# Patient Record
Sex: Female | Born: 1955 | Race: White | Hispanic: No | Smoking: Never smoker
Health system: Southern US, Community
[De-identification: ages and names within clinical notes are randomized; demographics above are authoritative.]

## PROBLEM LIST (undated history)

## (undated) DIAGNOSIS — K259 Gastric ulcer, unspecified as acute or chronic, without hemorrhage or perforation: Secondary | ICD-10-CM

## (undated) DIAGNOSIS — Z8669 Personal history of other diseases of the nervous system and sense organs: Secondary | ICD-10-CM

## (undated) DIAGNOSIS — G4733 Obstructive sleep apnea (adult) (pediatric): Secondary | ICD-10-CM

## (undated) DIAGNOSIS — G43109 Migraine with aura, not intractable, without status migrainosus: Secondary | ICD-10-CM

## (undated) DIAGNOSIS — F32A Depression, unspecified: Secondary | ICD-10-CM

## (undated) DIAGNOSIS — K219 Gastro-esophageal reflux disease without esophagitis: Secondary | ICD-10-CM

## (undated) DIAGNOSIS — I10 Essential (primary) hypertension: Secondary | ICD-10-CM

## (undated) DIAGNOSIS — R519 Headache, unspecified: Secondary | ICD-10-CM

## (undated) DIAGNOSIS — T7840XA Allergy, unspecified, initial encounter: Secondary | ICD-10-CM

## (undated) DIAGNOSIS — F329 Major depressive disorder, single episode, unspecified: Secondary | ICD-10-CM

## (undated) DIAGNOSIS — N200 Calculus of kidney: Secondary | ICD-10-CM

## (undated) DIAGNOSIS — R439 Unspecified disturbances of smell and taste: Secondary | ICD-10-CM

## (undated) HISTORY — PX: CHOLECYSTECTOMY: SHX55

## (undated) HISTORY — DX: Major depressive disorder, single episode, unspecified: F32.9

## (undated) HISTORY — PX: TENNIS ELBOW RELEASE/NIRSCHEL PROCEDURE: SHX6651

## (undated) HISTORY — PX: KNEE SURGERY: SHX244

## (undated) HISTORY — DX: Unspecified disturbances of smell and taste: R43.9

## (undated) HISTORY — DX: Calculus of kidney: N20.0

## (undated) HISTORY — DX: Allergy, unspecified, initial encounter: T78.40XA

## (undated) HISTORY — DX: Gastro-esophageal reflux disease without esophagitis: K21.9

## (undated) HISTORY — DX: Depression, unspecified: F32.A

## (undated) HISTORY — DX: Gastric ulcer, unspecified as acute or chronic, without hemorrhage or perforation: K25.9

---

## 2002-11-06 HISTORY — PX: CHOLECYSTECTOMY: SHX55

## 2004-11-06 HISTORY — PX: ENDOMETRIAL ABLATION W/ NOVASURE: SUR434

## 2005-02-22 ENCOUNTER — Encounter: Payer: Self-pay | Admitting: General Practice

## 2005-03-06 ENCOUNTER — Encounter: Payer: Self-pay | Admitting: General Practice

## 2005-03-24 ENCOUNTER — Ambulatory Visit: Payer: Self-pay | Admitting: Obstetrics and Gynecology

## 2006-05-08 ENCOUNTER — Ambulatory Visit: Payer: Self-pay | Admitting: Obstetrics and Gynecology

## 2007-02-15 ENCOUNTER — Ambulatory Visit: Payer: Self-pay | Admitting: Unknown Physician Specialty

## 2007-05-13 ENCOUNTER — Ambulatory Visit: Payer: Self-pay | Admitting: Obstetrics and Gynecology

## 2007-09-02 ENCOUNTER — Ambulatory Visit: Payer: Self-pay | Admitting: Physician Assistant

## 2007-12-31 ENCOUNTER — Ambulatory Visit: Payer: Self-pay | Admitting: Unknown Physician Specialty

## 2008-01-14 ENCOUNTER — Ambulatory Visit: Payer: Self-pay | Admitting: Unknown Physician Specialty

## 2008-05-13 ENCOUNTER — Ambulatory Visit: Payer: Self-pay | Admitting: Obstetrics and Gynecology

## 2009-03-11 ENCOUNTER — Ambulatory Visit: Payer: Self-pay | Admitting: Physician Assistant

## 2009-03-18 ENCOUNTER — Ambulatory Visit: Payer: Self-pay | Admitting: Internal Medicine

## 2009-05-17 ENCOUNTER — Ambulatory Visit: Payer: Self-pay | Admitting: Obstetrics and Gynecology

## 2010-01-17 ENCOUNTER — Ambulatory Visit: Payer: Self-pay | Admitting: Internal Medicine

## 2010-05-18 ENCOUNTER — Ambulatory Visit: Payer: Self-pay | Admitting: Obstetrics and Gynecology

## 2011-06-14 ENCOUNTER — Ambulatory Visit: Payer: Self-pay | Admitting: Obstetrics and Gynecology

## 2012-06-17 ENCOUNTER — Ambulatory Visit: Payer: Self-pay | Admitting: Obstetrics and Gynecology

## 2012-11-06 HISTORY — PX: OTHER SURGICAL HISTORY: SHX169

## 2013-06-16 ENCOUNTER — Ambulatory Visit: Payer: Self-pay | Admitting: Unknown Physician Specialty

## 2013-06-18 ENCOUNTER — Ambulatory Visit: Payer: Self-pay | Admitting: Obstetrics and Gynecology

## 2013-06-20 ENCOUNTER — Ambulatory Visit: Payer: Self-pay | Admitting: Obstetrics and Gynecology

## 2014-01-15 DIAGNOSIS — E559 Vitamin D deficiency, unspecified: Secondary | ICD-10-CM | POA: Insufficient documentation

## 2014-01-15 DIAGNOSIS — E039 Hypothyroidism, unspecified: Secondary | ICD-10-CM | POA: Insufficient documentation

## 2014-01-15 DIAGNOSIS — N83209 Unspecified ovarian cyst, unspecified side: Secondary | ICD-10-CM | POA: Insufficient documentation

## 2014-01-15 DIAGNOSIS — K219 Gastro-esophageal reflux disease without esophagitis: Secondary | ICD-10-CM | POA: Insufficient documentation

## 2014-03-05 ENCOUNTER — Ambulatory Visit: Payer: Self-pay | Admitting: Unknown Physician Specialty

## 2014-06-26 ENCOUNTER — Ambulatory Visit: Payer: Self-pay | Admitting: Obstetrics and Gynecology

## 2014-12-29 DIAGNOSIS — G44219 Episodic tension-type headache, not intractable: Secondary | ICD-10-CM | POA: Insufficient documentation

## 2015-03-16 DIAGNOSIS — R739 Hyperglycemia, unspecified: Secondary | ICD-10-CM | POA: Insufficient documentation

## 2015-04-15 DIAGNOSIS — M778 Other enthesopathies, not elsewhere classified: Secondary | ICD-10-CM | POA: Insufficient documentation

## 2015-06-17 ENCOUNTER — Other Ambulatory Visit: Payer: Self-pay | Admitting: Obstetrics and Gynecology

## 2015-06-17 DIAGNOSIS — Z1231 Encounter for screening mammogram for malignant neoplasm of breast: Secondary | ICD-10-CM

## 2015-06-28 ENCOUNTER — Ambulatory Visit
Admission: RE | Admit: 2015-06-28 | Discharge: 2015-06-28 | Disposition: A | Discharge status: Home / Self Care | Payer: BLUE CROSS/BLUE SHIELD | Source: Ambulatory Visit | Attending: Obstetrics and Gynecology | Admitting: Obstetrics and Gynecology

## 2015-06-28 DIAGNOSIS — Z1231 Encounter for screening mammogram for malignant neoplasm of breast: Secondary | ICD-10-CM | POA: Insufficient documentation

## 2015-12-24 ENCOUNTER — Emergency Department: Payer: BLUE CROSS/BLUE SHIELD

## 2015-12-24 ENCOUNTER — Encounter: Payer: Self-pay | Admitting: Emergency Medicine

## 2015-12-24 ENCOUNTER — Emergency Department
Admission: EM | Admit: 2015-12-24 | Discharge: 2015-12-24 | Disposition: A | Discharge status: Home / Self Care | Payer: BLUE CROSS/BLUE SHIELD | Attending: Emergency Medicine | Admitting: Emergency Medicine

## 2015-12-24 DIAGNOSIS — R0602 Shortness of breath: Secondary | ICD-10-CM | POA: Diagnosis not present

## 2015-12-24 DIAGNOSIS — R079 Chest pain, unspecified: Secondary | ICD-10-CM | POA: Diagnosis present

## 2015-12-24 DIAGNOSIS — R0789 Other chest pain: Secondary | ICD-10-CM | POA: Insufficient documentation

## 2015-12-24 DIAGNOSIS — Z79899 Other long term (current) drug therapy: Secondary | ICD-10-CM | POA: Insufficient documentation

## 2015-12-24 DIAGNOSIS — I10 Essential (primary) hypertension: Secondary | ICD-10-CM | POA: Diagnosis not present

## 2015-12-24 DIAGNOSIS — J3489 Other specified disorders of nose and nasal sinuses: Secondary | ICD-10-CM | POA: Insufficient documentation

## 2015-12-24 HISTORY — DX: Essential (primary) hypertension: I10

## 2015-12-24 LAB — BASIC METABOLIC PANEL
ANION GAP: 6 (ref 5–15)
BUN: 17 mg/dL (ref 6–20)
CHLORIDE: 106 mmol/L (ref 101–111)
CO2: 27 mmol/L (ref 22–32)
Calcium: 9 mg/dL (ref 8.9–10.3)
Creatinine, Ser: 0.93 mg/dL (ref 0.44–1.00)
GFR calc Af Amer: >60 mL/min (ref >60)
GLUCOSE: 106 mg/dL — AB (ref 65–99)
POTASSIUM: 3.9 mmol/L (ref 3.5–5.1)
Sodium: 139 mmol/L (ref 135–145)

## 2015-12-24 LAB — CBC
HEMATOCRIT: 39.4 % (ref 35.0–47.0)
HEMOGLOBIN: 13.1 g/dL (ref 12.0–16.0)
MCH: 27.7 pg (ref 26.0–34.0)
MCHC: 33.3 g/dL (ref 32.0–36.0)
MCV: 83.3 fL (ref 80.0–100.0)
Platelets: 256 10*3/uL (ref 150–440)
RBC: 4.72 MIL/uL (ref 3.80–5.20)
RDW: 14.1 % (ref 11.5–14.5)
WBC: 8.4 10*3/uL (ref 3.6–11.0)

## 2015-12-24 LAB — TROPONIN I

## 2015-12-24 MED ORDER — NAPROXEN 500 MG PO TABS: 500.0000 mg | ORAL_TABLET | Freq: Two times a day (BID) | ORAL | Status: DC

## 2015-12-24 MED ORDER — IPRATROPIUM-ALBUTEROL 0.5-2.5 (3) MG/3ML IN SOLN
3.0000 mL | Freq: Once | RESPIRATORY_TRACT | Status: AC
  Administered 2015-12-24: 3 mL via RESPIRATORY_TRACT
  Filled 2015-12-24: qty 3

## 2015-12-24 NOTE — ED Provider Notes (Signed)
[facility] Emergency Department Provider Note  ____________________________________________  Time seen: 3:20 PM  I have reviewed the triage vital signs and the nursing notes.   HISTORY  Chief Complaint Chest Pain    HPI Phyllis Wright is a 60 y.o. female who complains of gradual onset left-sided chest pain that feels like heaviness and tightness that started yesterday about 7 PM. She states that she was at rest at the time and it is not worse with exertion. She also feels somewhat short of breath. But no vomiting or diaphoresis. Nonradiating. It is not pleuritic or exertional. No recent travel trauma hospitalization surgery, no prior DVT or PE. Does not take any exogenous hormones. Nonsmoker.     Past Medical History  Diagnosis Date  . Hypertension      There are no active problems to display for this patient.    Past Surgical History  Procedure Laterality Date  . Cholecystectomy       Current Outpatient Rx  Name  Route  Sig  Dispense  Refill  . ALPRAZolam (XANAX) 0.5 MG tablet   Oral   Take 0.5 mg by mouth 2 (two) times daily as needed for anxiety.         . butalbital-acetaminophen-caffeine (FIORICET WITH CODEINE) 50-325-40-30 MG capsule   Oral   Take 1-2 capsules by mouth 2 (two) times daily as needed for headache.         . DULoxetine (CYMBALTA) 60 MG capsule   Oral   Take 60 mg by mouth daily.         Marland Kitchen lisinopril (PRINIVIL,ZESTRIL) 10 MG tablet   Oral   Take 10 mg by mouth daily.         Marland Kitchen omeprazole (PRILOSEC) 20 MG capsule   Oral   Take 20 mg by mouth 2 (two) times daily.         . naproxen (NAPROSYN) 500 MG tablet   Oral   Take 1 tablet (500 mg total) by mouth 2 (two) times daily with a meal.   20 tablet   0      Allergies Ceftin   Family History  Problem Relation Age of Onset  . Breast cancer Mother 73    Social History Social History  Substance Use Topics  . Smoking status: Never Smoker   .  Smokeless tobacco: None  . Alcohol Use: No    Review of Systems  Constitutional:   No fever or chills. No weight changes Eyes:   No blurry vision or double vision.  ENT:   No sore throat.  Cardiovascular:   Positive as above chest pain. Respiratory:   Positive shortness of breath, no cough. Gastrointestinal:   Negative for abdominal pain, vomiting and diarrhea.  No BRBPR or melena. Genitourinary:   Negative for dysuria or difficulty urinating. Musculoskeletal:   Negative for back pain. No joint swelling or pain. Skin:   Negative for rash. Neurological:   Negative for headaches, focal weakness or numbness. Psychiatric:  No anxiety or depression.   Endocrine:  No changes in energy or sleep difficulty.  10-point ROS otherwise negative.  ____________________________________________   PHYSICAL EXAM:  VITAL SIGNS: ED Triage Vitals  Enc Vitals Group     BP 12/24/15 1506 121/52 mmHg     Pulse Rate 12/24/15 1506 83     Resp 12/24/15 1506 20     Temp 12/24/15 1506 97.7 F (36.5 C)     Temp Source 12/24/15 1506 Oral  SpO2 12/24/15 1506 95 %     Weight 12/24/15 1506 145 lb (65.772 kg)     Height 12/24/15 1506 5\' 3"  (1.6 m)     Head Cir --      Peak Flow --      Pain Score 12/24/15 1505 4     Pain Loc --      Pain Edu? --      Excl. in [place]? --     Vital signs reviewed, nursing assessments reviewed.   Constitutional:   Alert and oriented. Well appearing and in no distress. Eyes:   No scleral icterus. No conjunctival pallor. PERRL. EOMI ENT   Head:   Normocephalic and atraumatic.   Nose:   Positive rhinorrhea. No septal hematoma   Mouth/Throat:   MMM, no pharyngeal erythema. No peritonsillar mass.    Neck:   No stridor. No SubQ emphysema. No meningismus. Hematological/Lymphatic/Immunilogical:   No cervical lymphadenopathy. Cardiovascular:   RRR. Symmetric bilateral radial and DP pulses.  No murmurs.  Respiratory:   Normal respiratory effort without tachypnea  nor retractions. Breath sounds are clear and equal bilaterally. No wheezes/rales/rhonchi. No inducible wheezing or coughing with forceful expiration Gastrointestinal:   Soft and nontender. Non distended. There is no CVA tenderness.  No rebound, rigidity, or guarding. Genitourinary:   deferred Musculoskeletal:   Nontender with normal range of motion in all extremities. No joint effusions.  No lower extremity tenderness.  No edema. Left upper chest wall is tender over the second and third intercostal spaces just lateral to the sternal border, which reproduces her pain. Neurologic:   Normal speech and language.  CN 2-10 normal. Motor grossly intact. No gross focal neurologic deficits are appreciated.  Skin:    Skin is warm, dry and intact. No rash noted.  No petechiae, purpura, or bullae. Psychiatric:   Mood and affect are normal. No SI or hallucinations ____________________________________________    LABS (pertinent positives/negatives) (all labs ordered are listed, but only abnormal results are displayed) Labs Reviewed  BASIC METABOLIC PANEL - Abnormal; Notable for the following:    Glucose, Bld 106 (*)    All other components within normal limits  TROPONIN I  CBC   ____________________________________________   EKG  Interpreted by me Normal sinus rhythm rate of 77, normal axis intervals QRS and ST segments and T waves  ____________________________________________    RADIOLOGY  cxr = nad  ____________________________________________   PROCEDURES   ____________________________________________   INITIAL IMPRESSION / ASSESSMENT AND PLAN / ED COURSE  Pertinent labs & imaging results that were available during my care of the patient were reviewed by me and considered in my medical decision making (see chart for details).  Patient presents with atypical chest pain.  Considering the patient's symptoms, medical history, and physical examination today, I have low suspicion  for ACS, PE, TAD, pneumothorax, carditis, mediastinitis, pneumonia, CHF, or sepsis. With the pain being reproducible chest wall palpation this appears to be an intercostal strain versus costochondritis. There is some evidence that she probably has a URI or viral illness as well. We'll have her take NSAIDs and follow up with primary care. We'll get a breathing treatment here in the ER in case that helps although she has no wheezing or other pulmonary findings.       ____________________________________________   FINAL CLINICAL IMPRESSION(S) / ED DIAGNOSES  Final diagnoses:  Chest wall pain      Carrie Mew, MD 12/24/15 [phone]

## 2015-12-24 NOTE — ED Notes (Signed)
States she developed some chest   Heaviness in chest last pm  Feels like something is sitting on chest and she can't get a breath

## 2015-12-24 NOTE — ED Notes (Signed)
Patient transported to X-ray at this time 

## 2015-12-24 NOTE — Discharge Instructions (Signed)

## 2016-06-27 ENCOUNTER — Other Ambulatory Visit: Payer: Self-pay | Admitting: Obstetrics and Gynecology

## 2016-06-27 DIAGNOSIS — Z1231 Encounter for screening mammogram for malignant neoplasm of breast: Secondary | ICD-10-CM

## 2016-07-05 ENCOUNTER — Ambulatory Visit: Payer: BLUE CROSS/BLUE SHIELD

## 2016-07-21 ENCOUNTER — Ambulatory Visit
Admission: RE | Admit: 2016-07-21 | Discharge: 2016-07-21 | Disposition: A | Discharge status: Home / Self Care | Payer: BLUE CROSS/BLUE SHIELD | Source: Ambulatory Visit | Attending: Obstetrics and Gynecology | Admitting: Obstetrics and Gynecology

## 2016-07-21 DIAGNOSIS — Z1231 Encounter for screening mammogram for malignant neoplasm of breast: Secondary | ICD-10-CM | POA: Insufficient documentation

## 2016-08-01 ENCOUNTER — Encounter: Payer: Self-pay | Admitting: *Deleted

## 2016-08-15 ENCOUNTER — Encounter: Payer: Self-pay | Admitting: General Surgery

## 2016-08-15 ENCOUNTER — Ambulatory Visit (INDEPENDENT_AMBULATORY_CARE_PROVIDER_SITE_OTHER): Payer: BLUE CROSS/BLUE SHIELD | Admitting: General Surgery

## 2016-08-15 VITALS — BP 118/66 | HR 74 | Resp 12 | Ht 63.0 in | Wt 146.0 lb

## 2016-08-15 DIAGNOSIS — D1720 Benign lipomatous neoplasm of skin and subcutaneous tissue of unspecified limb: Secondary | ICD-10-CM | POA: Diagnosis not present

## 2016-08-15 DIAGNOSIS — D171 Benign lipomatous neoplasm of skin and subcutaneous tissue of trunk: Secondary | ICD-10-CM | POA: Diagnosis not present

## 2016-08-15 NOTE — Patient Instructions (Addendum)
Follow up as needed

## 2016-08-15 NOTE — Progress Notes (Signed)
Patient ID: Phyllis Wright, female   DOB: November 05, 1956, 60 y.o.   MRN: [id number]  Chief Complaint  Patient presents with  . Lipoma    HPI Phyllis Wright is a 60 y.o. female here today for a evaluation of a lipoma on lower back. Patient states she noticed this area about five years ago. No change in size, some pain with activity. Patient states she has lost about 20 pounds over the last two years.  HPI  Past Medical History:  Diagnosis Date  . Allergy   . GERD (gastroesophageal reflux disease)   . Hypertension   . Smell or taste problem     Past Surgical History:  Procedure Laterality Date  . CHOLECYSTECTOMY    . colonoscopy  2014  . KNEE SURGERY Right   . TENNIS ELBOW RELEASE/NIRSCHEL PROCEDURE      Family History  Problem Relation Age of Onset  . Breast cancer Mother 24  . Prostate cancer Father   . Ovarian cancer Sister     Social History Social History  Substance Use Topics  . Smoking status: Never Smoker  . Smokeless tobacco: Never Used  . Alcohol use No    Allergies  Allergen Reactions  . Ceftin [Cefuroxime Axetil] Itching and Rash    Current Outpatient Prescriptions  Medication Sig Dispense Refill  . ALPRAZolam (XANAX) 0.5 MG tablet Take 0.5 mg by mouth 2 (two) times daily as needed for anxiety.    Marland Kitchen b complex vitamins capsule Take 1 capsule by mouth daily.    . butalbital-acetaminophen-caffeine (FIORICET WITH CODEINE) 50-325-40-30 MG capsule Take 1-2 capsules by mouth 2 (two) times daily as needed for headache.    . calcium carbonate (OSCAL) 1500 (600 Ca) MG TABS tablet Take 600 mg of elemental calcium by mouth daily.    . cholecalciferol (VITAMIN D) 1000 units tablet Take 1,000 Units by mouth daily.    . DULoxetine (CYMBALTA) 60 MG capsule Take 60 mg by mouth daily.    Marland Kitchen lisinopril (PRINIVIL,ZESTRIL) 10 MG tablet Take 10 mg by mouth at bedtime.     . Melatonin 10 MG TABS Take 10 mg by mouth at bedtime.    Marland Kitchen omeprazole (PRILOSEC) 20 MG capsule Take 20 mg  by mouth 2 (two) times daily.     No current facility-administered medications for this visit.     Review of Systems Review of Systems  Constitutional: Negative.   Respiratory: Negative.   Cardiovascular: Negative.     Blood pressure 118/66, pulse 74, resp. rate 12, height 5\' 3"  (1.6 m), weight 146 lb (66.2 kg).  Physical Exam Physical Exam  Constitutional: She is oriented to person, place, and time. She appears well-developed and well-nourished.  Eyes: Conjunctivae are normal. No scleral icterus.  Neck: Neck supple.  Cardiovascular: Normal rate, regular rhythm and normal heart sounds.   Pulmonary/Chest: Effort normal and breath sounds normal.  Musculoskeletal:       Back:  Lymphadenopathy:    She has no cervical adenopathy.  Neurological: She is alert and oriented to person, place, and time.  Skin: Skin is warm and dry.    Data Reviewed Dermatology notes of 07/27/2016  Assessment    Symptomatic lipoma involving the left back.    Plan    Excision options were reviewed. The patient was amenable to proceed. The skin was cleansed with ChloraPrep and 20 mL of 0.5% Xylocaine with 0.25% Marcaine with 1 200,000 epinephrine was utilized well tolerated. ChloraPrep was again applied to the skin.  A transverse incision was made over the lesion. This was a superficially located multilobulated lipoma above the level of the deep fascia. No bleeding was noted. The wound was closed with a running 3-0 Vicryl deep dermal suture. Benzoin and Steri-Strips applied followed by Telfa and Tegaderm dressing.  Ice pack applied. Postoperative wound care reviewed. Tegaderm to be removed in 3 days. She may return in one week for nursing assessment if desired.    This information has been scribed by Gaspar Cola CMA.   Robert Bellow 08/15/2016, 5:02 PM

## 2016-08-22 ENCOUNTER — Telehealth: Payer: Self-pay | Admitting: *Deleted

## 2016-08-22 NOTE — Telephone Encounter (Signed)
Patient is aware of pathology results, she verbally understood and is pleased.

## 2016-12-27 ENCOUNTER — Ambulatory Visit (INDEPENDENT_AMBULATORY_CARE_PROVIDER_SITE_OTHER): Payer: BLUE CROSS/BLUE SHIELD | Admitting: Licensed Clinical Social Worker

## 2016-12-27 ENCOUNTER — Encounter: Payer: Self-pay | Admitting: Licensed Clinical Social Worker

## 2016-12-27 DIAGNOSIS — F411 Generalized anxiety disorder: Secondary | ICD-10-CM

## 2016-12-27 DIAGNOSIS — F332 Major depressive disorder, recurrent severe without psychotic features: Secondary | ICD-10-CM

## 2016-12-27 NOTE — Progress Notes (Signed)
Comprehensive Clinical Assessment (CCA) Note  12/27/2016 Phyllis Wright [id number]  Visit Diagnosis:      ICD-9-CM ICD-10-CM   1. Severe episode of recurrent major depressive disorder, without psychotic features ([place]) 296.33 F33.2   2. Generalized anxiety disorder 300.02 F41.1       CCA Part One  Part One has been completed on paper by the patient.  (See scanned document in Chart Review)  CCA Part Two A  Intake/Chief Complaint:  CCA Intake With Chief Complaint CCA Part Two Date: 12/27/16 CCA Part Two Time: 1402 Chief Complaint/Presenting Problem: She is under a lot of stress. 4 years ago lost taste and smell. 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 Parents 16 & 55 this year. Mother has dementia-alzheimer's. Father slipping also. Has hearing aids that he will not wear. Lots of yelling in house. Sister moved to [place] 30 years ago and diagnosed with a rare cancer. She does not have a very good prognosis. One of her daughters is pregnant. They are keeping it from grandparents. grandparents want to help but are more work than helpful. Sister was suicidal last month. Husband 76 this year. recovering alcoholic 20 + years quadruple bypass in 95. Probably having bilateral renal artery by pass in spring. stress with stepchildren Patients Currently Reported Symptoms/Problems: stress, anxiety, depression Collateral Involvement: supports-husband,  Individual's Strengths: "I have not strangled any of them yet" "I am still here" Individual's Preferences: "I should be happier"  Individual's Abilities: computers Type of Services Patient Feels Are Needed: medication management, therapist Initial Clinical Notes/Concerns: no past psychiatric history, need to get eyelid surgery, things in limbo until surgery, probably not have to go back to work and that should be good but still in limbo  Mental Health Symptoms Depression:  Depression: Tearfulness, Change in  energy/activity, Fatigue, Difficulty Concentrating, Increase/decrease in appetite, Irritability, Sleep (too much or little), Worthlessness (denies SI, denies past SA or SIB, rates depression as a 5 out of 10)  Mania:  Mania: N/A  Anxiety:   Anxiety: Difficulty concentrating, Fatigue, Irritability, Restlessness, Sleep, Tension, Worrying (denies panic)  Psychosis:  Psychosis: N/A  Trauma:  Trauma: N/A  Obsessions:  Obsessions: N/A  Compulsions:  Compulsions: N/A  Inattention:  Inattention: N/A  Hyperactivity/Impulsivity:  Hyperactivity/Impulsivity: N/A  Oppositional/Defiant Behaviors:  Oppositional/Defiant Behaviors: N/A  Borderline Personality:  Emotional Irregularity: N/A  Other Mood/Personality Symptoms:      Mental Status Exam Appearance and self-care  Stature:  Stature: Small  Weight:  Weight: Average weight  Clothing:  Clothing: Casual  Grooming:  Grooming: Normal  Cosmetic use:  Cosmetic Use: None  Posture/gait:  Posture/Gait: Normal  Motor activity:  Motor Activity: Not Remarkable  Sensorium  Attention:  Attention: Normal  Concentration:  Concentration: Normal  Orientation:  Orientation: X5  Recall/memory:  Recall/Memory: Normal  Affect and Mood  Affect:  Affect: Depressed  Mood:  Mood: Anxious, Depressed  Relating  Eye contact:  Eye Contact: Normal  Facial expression:  Facial Expression: Depressed, Sad  Attitude toward examiner:  Attitude Toward Examiner: Cooperative  Thought and Language  Speech flow: Speech Flow: Normal  Thought content:  Thought Content: Appropriate to mood and circumstances  Preoccupation:     Hallucinations:     Organization:     Transport planner of Knowledge:  Fund of Knowledge: Average  Intelligence:  Intelligence: Average  Abstraction:  Abstraction: Normal  Judgement:  Judgement: Fair  Art therapist:  Pension scheme manager  Insight:  Insight: Fair  Decision Making:  Decision Making: Normal  Social Functioning  Social  Maturity:  Social Maturity: Responsible  Social Judgement:  Social Judgement: Normal  Stress  Stressors:  Stressors: Transitions (family stressors-parents, sister, stepchildren)  Coping Ability:  Coping Ability: English as a second language teacher Deficits:     Supports:      Family and Psychosocial History: Family history Marital status: Married Number of Years Married: 30 What types of issues is patient dealing with in the relationship?: worried about husband's medical issues Are you sexually active?: Yes What is your sexual orientation?: heterosexual Has your sexual activity been affected by drugs, alcohol, medication, or emotional stress?: stress Does patient have children?: Yes How many children?: 2 How is patient's relationship with their children?: two stepkids-stepson is lazy, take too much of her money, stepdaughter is alcoholic and won't admit it, worries about grandchildren, he is 51 and she will be 68  Childhood History:  Childhood History By whom was/is the patient raised?: Both parents Additional childhood history information: good Description of patient's relationship with caregiver when they were a child: mom, dad Patient's description of current relationship with people who raised him/her: stressful How were you disciplined when you got in trouble as a child/adolescent?: didn't have to be very much Does patient have siblings?: Yes Number of Siblings: 1 Description of patient's current relationship with siblings: good -younger sister Did patient suffer any verbal/emotional/physical/sexual abuse as a child?: No Did patient suffer from severe childhood neglect?: No Has patient ever been sexually abused/assaulted/raped as an adolescent or adult?: No Was the patient ever a victim of a crime or a disaster?: No Witnessed domestic violence?: No Has patient been effected by domestic violence as an adult?: No  CCA Part Two B  Employment/Work Situation: Employment / Work  Copywriter, advertising Employment situation: Unemployed (lost job and got 80 months severance, Chief of Staff worked at [employer]) What is the longest time patient has a held a job?: 27 Where was the patient employed at that time?: [employer]-started with paying insurance claims, benefits, running the payroll-HR Has patient ever been in the [employer]?: No Has patient ever served in combat?: No Did You Receive Any Psychiatric Treatment/Services While in Passenger transport manager?: No Are There Guns or Other Weapons in [place]?: Yes Types of Guns/Weapons: [place], [place], a couple of shotguns Are These Psychologist, educational?: Yes  Education: Education School Currently Attending: no Last Grade Completed: 13 Name of High School: [employer] Did You Graduate From [employer]?: Yes Did Physicist, medical?: Yes What Was Your Major?: planning on some type of medical technology Did You Have An Individualized Education Program (IIEP): No Did You Have Any Difficulty At School?: No  Religion: Religion/Spirituality Are You A Religious Person?: No  Leisure/Recreation: Leisure / Recreation Leisure and Hobbies: reading, Haematologist, travel  Exercise/Diet: Exercise/Diet Do You Exercise?: No Have You Gained or Lost A Significant Amount of Weight in the Past Six Months?: No Do You Follow a Special Diet?: No Do You Have Any Trouble Sleeping?: Yes Explanation of Sleeping Difficulties: can't quit thinking sometimes so trouble sleeping  CCA Part Two C  Alcohol/Drug Use: Alcohol / Drug Use History of alcohol / drug use?: No history of alcohol / drug abuse                      CCA Part Three  ASAM's:  Six Dimensions of Multidimensional Assessment  Dimension 1:  Acute Intoxication and/or Withdrawal Potential:  Dimension 2:  Biomedical Conditions and Complications:     Dimension 3:  Emotional, Behavioral, or Cognitive Conditions and Complications:     Dimension 4:  Readiness to Change:     Dimension 5:   Relapse, Continued use, or Continued Problem Potential:     Dimension 6:  Recovery/Living Environment:      Substance use Disorder (SUD)    Social Function:  Social Functioning Social Maturity: Responsible Social Judgement: Normal  Stress:  Stress Stressors: Transitions (family stressors-parents, sister, stepchildren) Coping Ability: Overwhelmed Patient Takes Medications The Way The Doctor Instructed?: Yes Priority Risk: Low Acuity  Risk Assessment- Self-Harm Potential: Risk Assessment For Self-Harm Potential Thoughts of Self-Harm: No current thoughts Method: No plan Availability of Means: No access/NA Additional Information for Self-Harm Potential: Family History of Suicide Additional Comments for Self-Harm Potential: father at 20 said he was going to shoot himself and he went into the hospital for a couple of days, he was cheating on mom  Risk Assessment -Dangerous to Others Potential: Risk Assessment For Dangerous to Others Potential Method: No Plan Availability of Means: No access or NA Intent: Vague intent or NA Notification Required: No need or identified person  DSM5 Diagnoses: Patient Active Problem List   Diagnosis Date Noted  . Lipoma of lower back 08/15/2016    Patient Centered Plan: Patient is on the following Treatment Plan(s):  Anxiety and Depression, stress management, and plan will be formulated at next treatment session  Recommendations for Services/Supports/Treatments: Recommendations for Services/Supports/Treatments Recommendations For Services/Supports/Treatments: Individual Therapy, Medication Management  Treatment Plan Summary: Patient is a 60 year old married female who is self-referred and presents with depression and anxiety. She shared that she has lots of stressors currently. She has aging parents putting her mom who has Alzheimer's and their care is becoming more difficult. Her sister has a rare cancer, she lost her job of 27 years a year ago  and frustrated with company who hurt the person she trained, and relates stress with stepchildren. She denies HI, SI or past SA, SIB or substance abuse. This is her first psychiatric experience. She is recommended for individual therapy to help with coping strategies for stressors, emotional regulation strategies, supportive interventions as well as referral to psychiatrist    Referrals to Alternative Service(s): Referred to Alternative Service(s):   Place:   Date:   Time:    Referred to Alternative Service(s):   Place:   Date:   Time:    Referred to Alternative Service(s):   Place:   Date:   Time:    Referred to Alternative Service(s):   Place:   Date:   Time:     Ruger Saxer A

## 2017-01-11 ENCOUNTER — Ambulatory Visit (INDEPENDENT_AMBULATORY_CARE_PROVIDER_SITE_OTHER): Payer: BLUE CROSS/BLUE SHIELD | Admitting: Licensed Clinical Social Worker

## 2017-01-11 DIAGNOSIS — F332 Major depressive disorder, recurrent severe without psychotic features: Secondary | ICD-10-CM

## 2017-01-11 DIAGNOSIS — F411 Generalized anxiety disorder: Secondary | ICD-10-CM

## 2017-01-11 NOTE — Progress Notes (Signed)
   THERAPIST PROGRESS NOTE  Session Time: 8:05 AM to 9 PM  Participation Level: Active  Behavioral Response: CasualAlertDysphoric  Type of Therapy: Individual Therapy  Treatment Goals addressed:  decrease in depression, anxiety  Interventions: Solution Focused, Strength-based, Supportive and Other: Stress management  Summary: Phyllis Wright is a 61 y.o. female who presents with discussing stressors. The stress of the unknown that includes whether she will be retiring in the near future or not. Describes depressive symptoms of feeling stuck, not doing things and can't get started. She has migraines started Zonegran but keeping her awake. She described the stress of managing the care of her parents who live nearby. She describes they are unaware of her own limitations and need close monitoring. She has  no outlet to get away from it all. She has time for heself but waiting for what is going to happen next. She completed treatment plan and wants to work on strategies to help decrease depression, anxiety, manage stress and one of the indications of her progress will be not stepping out is her husband is much. Discussed medications and patient feels it will be helpful in relieving symptoms so she is set up an appointment with the psychiatrist. She reviewed session and session helped her come to awareness that she has to take over more of her parents care, should she is going to get them angry but she is going to take control so she does not have to worry about them.  Suicidal/Homicidal: No  Therapist Response: Formulated a treatment plan with patient. Identified that patient will work on decrease in depression, anxiety and stress management. Help patient to process emotions around stressors. Discussed problem solving to help patient manage stressors. Provided supportive feedback with patient being more assertive in managing her parents care in recognizing this would be best for them and help with her  symptoms. Provided education on depression and explored triggers for anxiety and depression. Identified the stress of the unknown as one of the underlying causes and the patient in focusing on what she could do to address stressors to help her go from worry to more effective coping strategy of problem solving. Explored with patient what would it take to be happy to help her start to develop a plan to help improve mood. Discussed medications and related that medications would help with depression and anxiety symptoms. Provided supportive and strength-based interventions.    Plan: Return again in 2 weeks.2. Patient to schedule appointment with psychiatrist for medication management.3. Patient continued to gain insight and apply coping strategies to manage depression and anxiety.  Diagnosis: Axis I:  major depressive disorder, recurrent, severe, generalized anxiety disorder    Axis II: No diagnosis    Ac Colan A, LCSW 01/11/2017

## 2017-01-25 ENCOUNTER — Ambulatory Visit: Payer: BLUE CROSS/BLUE SHIELD | Admitting: Psychiatry

## 2017-02-21 ENCOUNTER — Ambulatory Visit: Payer: BLUE CROSS/BLUE SHIELD | Admitting: Licensed Clinical Social Worker

## 2017-03-06 ENCOUNTER — Ambulatory Visit: Payer: BLUE CROSS/BLUE SHIELD | Admitting: Psychiatry

## 2017-07-03 ENCOUNTER — Other Ambulatory Visit: Payer: Self-pay | Admitting: Obstetrics and Gynecology

## 2017-07-03 DIAGNOSIS — Z1231 Encounter for screening mammogram for malignant neoplasm of breast: Secondary | ICD-10-CM

## 2017-07-24 ENCOUNTER — Ambulatory Visit
Admission: RE | Admit: 2017-07-24 | Discharge: 2017-07-24 | Disposition: A | Discharge status: Home / Self Care | Payer: BLUE CROSS/BLUE SHIELD | Source: Ambulatory Visit | Attending: Obstetrics and Gynecology | Admitting: Obstetrics and Gynecology

## 2017-07-24 DIAGNOSIS — Z1231 Encounter for screening mammogram for malignant neoplasm of breast: Secondary | ICD-10-CM | POA: Diagnosis present

## 2018-03-21 ENCOUNTER — Ambulatory Visit: Payer: BLUE CROSS/BLUE SHIELD | Attending: Orthopedic Surgery | Admitting: Occupational Therapy

## 2018-03-21 ENCOUNTER — Other Ambulatory Visit: Payer: Self-pay

## 2018-03-21 DIAGNOSIS — R600 Localized edema: Secondary | ICD-10-CM | POA: Insufficient documentation

## 2018-03-21 DIAGNOSIS — M25641 Stiffness of right hand, not elsewhere classified: Secondary | ICD-10-CM | POA: Diagnosis present

## 2018-03-21 DIAGNOSIS — M79641 Pain in right hand: Secondary | ICD-10-CM | POA: Insufficient documentation

## 2018-03-21 NOTE — Therapy (Signed)
[facility] [address] [place], [address] Phone: [phone]   Fax:  [phone]  Occupational Therapy Evaluation  Patient Details  Name: Phyllis Wright MRN: [id number] Date of Birth: 07-27-1956 Referring Provider: Rudene Christians   Encounter Date: 03/21/2018  OT End of Session - 03/21/18 1607    Visit Number  1    Number of Visits  3    Date for OT Re-Evaluation  04/11/18    OT Start Time  1140    OT Stop Time  1224    OT Time Calculation (min)  44 min    Activity Tolerance  Patient tolerated treatment well    Behavior During Therapy  [facility] for tasks assessed/performed       Past Medical History:  Diagnosis Date  . Allergy   . GERD (gastroesophageal reflux disease)   . Hypertension   . Smell or taste problem     Past Surgical History:  Procedure Laterality Date  . CHOLECYSTECTOMY    . colonoscopy  2014  . KNEE SURGERY Right   . TENNIS ELBOW RELEASE/NIRSCHEL PROCEDURE      There were no vitals filed for this visit.  Subjective Assessment - 03/21/18 1601    Subjective   I had the trigger finger for while and had shot - but then since surgery - still some stiffness , tightness and some pain in the am - otherwise doing okay     Patient Stated Goals  Want my fingers just better so I can not have the tightness and stiffness-     Currently in Pain?  No/denies        [facility] OT Assessment - 03/21/18 0001      Assessment   Medical Diagnosis  R 3rd digit trigger finger release     Referring Provider  Menz    Onset Date/Surgical Date  12/19/17    Hand Dominance  Right      Home  Environment   Lives With  Alone      Prior Function   Vocation  Retired    Leisure  yardwork , built things, crafts , house work self       Edema   Edema  L 3rd PIP 5.9 cm and R PIP 6.4 cm       Strength   Right Hand Grip (lbs)  40    Right Hand Lateral Pinch  17 lbs    Right Hand 3 Point Pinch  14 lbs    Left Hand Grip  (lbs)  48    Left Hand Lateral Pinch  17 lbs    Left Hand 3 Point Pinch  16 lbs      Right Hand AROM   R Long  MCP 0-90  -- -20         contrast done with pt prior to review of HEP   edema increase 0.5 cm at 3rd PIP   compression provided digi sleeve  And ed on HEP :    Contrast   scar massage   digits extention - PROM and place and hold to 3rd  Tendon glides  10 reps   Digisleeve for 3rd digit on R hand for night time  Joint protection -enlarge handles and loose grip - not tight and sustained grip on tools  Use larger joints - palm to carry and pick up objects             OT Education -  03/21/18 1607    Education provided  Yes    Education Details  findings , Homeprogram     Person(s) Educated  Patient    Methods  Explanation;Demonstration;Handout;Verbal cues;Tactile cues    Comprehension  Verbalized understanding;Returned demonstration          OT Long Term Goals - 03/21/18 1611      OT LONG TERM GOAL #1   Title  Pt to be ind in HEP to decrease edema , pain , stiffness in R dominant hand and 3rd digit     Baseline  edema increase 0.5 cm at PIP 3rd , pain 4/10  in am     Time  3    Period  Weeks    Status  New    Target Date  04/11/18      OT LONG TERM GOAL #2   Title  AROM for 3rd digit extention improve to same as others to get hand in glove with more ease    Baseline  MC 3rd R hand -20 for extention     Time  3    Period  Weeks    Status  New    Target Date  04/11/18            Plan - 03/21/18 1608    Clinical Impression Statement   Pt present with reports of increase stiffness and tightness in R 3rd digit in the am with some soreness to about 4/10 at the worse- cannot straighten digit - but otherwise can use it in all activities - some edema - but scar feels not thick or adhere - pt ed on doing contrast to decrease edema and stiffness  -decrease pain -and increase ROM  - pt also can benefit from modification to tasks - she  is very  active and use her hands  a lot     Occupational performance deficits (Please refer to evaluation for details):  IADL's;Play;Leisure    Rehab Potential  Good    OT Frequency  1x / week    OT Duration  -- 3 wks    OT Treatment/Interventions  Therapeutic exercise;Self-care/ADL training;Patient/family education;Scar mobilization;Manual Therapy;Passive range of motion;Contrast Bath;Ultrasound;Fluidtherapy    Plan  assess progress with homeprogram     Clinical Decision Making  Limited treatment options, no task modification necessary    OT Home Exercise Plan  see pt insttruction     Consulted and Agree with Plan of Care  Patient       Patient will benefit from skilled therapeutic intervention in order to improve the following deficits and impairments:  Pain, Impaired flexibility, Increased edema, Decreased scar mobility, Impaired UE functional use  Visit Diagnosis: Localized edema - Plan: Ot plan of care cert/re-cert  Pain in right hand - Plan: Ot plan of care cert/re-cert  Stiffness of right hand, not elsewhere classified - Plan: Ot plan of care cert/re-cert    Problem List Patient Active Problem List   Diagnosis Date Noted  . Lipoma of lower back 08/15/2016    Rosalyn Gess OTR/l,CLT 03/21/2018, 4:15 PM  [facility] [address], [place], [address] Phone: [phone]   Fax:  [phone]  Name: Phyllis Wright MRN: [id number] Date of Birth: 1956-03-04

## 2018-03-21 NOTE — Patient Instructions (Signed)
Contrast   scar massage   digits extention - PROM and place and hold to 3rd  Tendon glides  10 reps   Digisleeve for 3rd digit on R hand for night time  Joint protection -enlarge handles and loose grip - not tight and sustained grip on tools  Use larger joints - palm to carry and pick up objects

## 2018-04-02 ENCOUNTER — Ambulatory Visit: Payer: BLUE CROSS/BLUE SHIELD | Admitting: Occupational Therapy

## 2018-04-02 DIAGNOSIS — R600 Localized edema: Secondary | ICD-10-CM | POA: Diagnosis not present

## 2018-04-02 DIAGNOSIS — M25641 Stiffness of right hand, not elsewhere classified: Secondary | ICD-10-CM

## 2018-04-02 DIAGNOSIS — M79641 Pain in right hand: Secondary | ICD-10-CM

## 2018-04-02 NOTE — Therapy (Signed)
[facility] [address] [place], [address] Phone: [phone]   Fax:  [phone]  Occupational Therapy Treatment  Patient Details  Name: Phyllis Wright MRN: [id number] Date of Birth: 14-Aug-1956 Referring Provider: Rudene Christians   Encounter Date: 04/02/2018  OT End of Session - 04/02/18 1110    Visit Number  23    Date for OT Re-Evaluation  04/11/18    OT Start Time  1046    OT Stop Time  1104    OT Time Calculation (min)  18 min    Activity Tolerance  Patient tolerated treatment well    Behavior During Therapy  [facility] for tasks assessed/performed       Past Medical History:  Diagnosis Date  . Allergy   . GERD (gastroesophageal reflux disease)   . Hypertension   . Smell or taste problem     Past Surgical History:  Procedure Laterality Date  . CHOLECYSTECTOMY    . colonoscopy  2014  . KNEE SURGERY Right   . TENNIS ELBOW RELEASE/NIRSCHEL PROCEDURE      There were no vitals filed for this visit.  Subjective Assessment - 04/02/18 1109    Subjective   My mom past away this am - and my sister the 6th - just come back from her funeral - doing okay - my mom was in memory care - finger is doing okay -not as tight in the am and  no pain -and finger is getting more straight     Patient Stated Goals  Want my fingers just better so I can not have the tightness and stiffness-     Currently in Pain?  No/denies         [facility] OT Assessment - 04/02/18 0001      Edema   Edema  PIP of 3rd same as L       Strength   Right Hand Grip (lbs)  43 c/p tennis elbow    Right Hand 3 Point Pinch  14 lbs      Right Hand AROM   R Long  MCP 0-90  -- -10       Measurement taken - pt edema decrease  And MC extention 50% better - to cont with same HEP   during lumbrical fist - to stabilize proximal phalanges on dorsal side and extend PIP  Pt report less clicking in joint and  And tenderness   Grip improve - pt do report  that she has tennis elbow on the R - had shot in past  Provided for pt 2 new silicon compression sleeves for edema - pt report decrease tightness in middle joint on 3rd digit                  OT Education - 04/02/18 1110    Education provided  Yes    Education Details  cont with same HEP and provided 2 xtra compression sleeves for middle finger    Person(s) Educated  Patient    Methods  Explanation;Demonstration;Tactile cues    Comprehension  Verbalized understanding          OT Long Term Goals - 03/21/18 1611      OT LONG TERM GOAL #1   Title  Pt to be ind in HEP to decrease edema , pain , stiffness in R dominant hand and 3rd digit     Baseline  edema increase 0.5 cm at PIP 3rd , pain 4/10  in am     Time  3    Period  Weeks    Status  New    Target Date  04/11/18      OT LONG TERM GOAL #2   Title  AROM for 3rd digit extention improve to same as others to get hand in glove with more ease    Baseline  MC 3rd R hand -20 for extention     Time  3    Period  Weeks    Status  New    Target Date  04/11/18            Plan - 04/02/18 1111    Clinical Impression Statement  Pt was seen last week for eval and showed decrease edema ,  soreness, and tightness - and increase 3rd digit extention  - pt to cont with same HEP to increase 3rd MC extention the last 10 degrees-  pt to contact me if  need to be seen again     Occupational performance deficits (Please refer to evaluation for details):  IADL's;Play;Leisure    Rehab Potential  Good    OT Frequency  -- contace me if need to be seen     OT Treatment/Interventions  Therapeutic exercise;Self-care/ADL training;Patient/family education;Scar mobilization;Manual Therapy;Passive range of motion;Contrast Bath;Ultrasound;Fluidtherapy    Plan  pt to cont with HEP and contact me as needed     Clinical Decision Making  Limited treatment options, no task modification necessary    OT Home Exercise Plan  see pt insttruction      Consulted and Agree with Plan of Care  Patient       Patient will benefit from skilled therapeutic intervention in order to improve the following deficits and impairments:  Pain, Impaired flexibility, Increased edema, Decreased scar mobility, Impaired UE functional use  Visit Diagnosis: Localized edema  Pain in right hand  Stiffness of right hand, not elsewhere classified    Problem List Patient Active Problem List   Diagnosis Date Noted  . Lipoma of lower back 08/15/2016    Rosalyn Gess OTR/L,CLT 04/02/2018, 11:17 AM  [facility] [address] [place], [address] Phone: [phone]   Fax:  [phone]  Name: Phyllis Wright MRN: [id number] Date of Birth: Mar 31, 1956

## 2018-04-02 NOTE — Patient Instructions (Signed)
Cont with same HEP  

## 2018-04-25 ENCOUNTER — Other Ambulatory Visit: Payer: Self-pay | Admitting: Internal Medicine

## 2018-04-25 DIAGNOSIS — R319 Hematuria, unspecified: Secondary | ICD-10-CM

## 2018-05-01 ENCOUNTER — Ambulatory Visit: Payer: BLUE CROSS/BLUE SHIELD

## 2018-05-16 DIAGNOSIS — R3129 Other microscopic hematuria: Secondary | ICD-10-CM | POA: Insufficient documentation

## 2018-05-23 ENCOUNTER — Ambulatory Visit (INDEPENDENT_AMBULATORY_CARE_PROVIDER_SITE_OTHER): Payer: BLUE CROSS/BLUE SHIELD | Admitting: Urology

## 2018-05-23 ENCOUNTER — Encounter: Payer: Self-pay | Admitting: Urology

## 2018-05-23 VITALS — BP 132/77 | HR 71 | Resp 16 | Ht 63.0 in | Wt 148.8 lb

## 2018-05-23 DIAGNOSIS — R3129 Other microscopic hematuria: Secondary | ICD-10-CM

## 2018-05-23 DIAGNOSIS — I10 Essential (primary) hypertension: Secondary | ICD-10-CM | POA: Insufficient documentation

## 2018-05-23 DIAGNOSIS — F329 Major depressive disorder, single episode, unspecified: Secondary | ICD-10-CM | POA: Insufficient documentation

## 2018-05-23 DIAGNOSIS — F32A Depression, unspecified: Secondary | ICD-10-CM | POA: Insufficient documentation

## 2018-05-23 DIAGNOSIS — J302 Other seasonal allergic rhinitis: Secondary | ICD-10-CM | POA: Insufficient documentation

## 2018-05-23 DIAGNOSIS — D3501 Benign neoplasm of right adrenal gland: Secondary | ICD-10-CM | POA: Insufficient documentation

## 2018-05-23 DIAGNOSIS — F419 Anxiety disorder, unspecified: Secondary | ICD-10-CM | POA: Insufficient documentation

## 2018-05-23 DIAGNOSIS — E282 Polycystic ovarian syndrome: Secondary | ICD-10-CM | POA: Insufficient documentation

## 2018-05-23 LAB — URINALYSIS, COMPLETE
Bilirubin, UA: NEGATIVE
Glucose, UA: NEGATIVE
Ketones, UA: NEGATIVE
LEUKOCYTES UA: NEGATIVE
Nitrite, UA: NEGATIVE
PH UA: 7 (ref 5.0–7.5)
PROTEIN UA: NEGATIVE
Specific Gravity, UA: 1.01 (ref 1.005–1.030)
Urobilinogen, Ur: 0.2 mg/dL (ref 0.2–1.0)

## 2018-05-23 LAB — MICROSCOPIC EXAMINATION

## 2018-05-23 NOTE — Progress Notes (Signed)
05/23/2018 2:44 PM   York Ram [birth year] [id number]  Referring provider: Adin Hector, MD [facility] [place],  [address]  Chief Complaint  Patient presents with  . Hematuria    New patient    HPI: Phyllis Wright is a 62 year old female seen in consultation at the request of Dr. Caryl Comes for evaluation of microhematuria and a renal cyst.  Since 2017 she has had urinalysis showing small to moderate blood on dipstick and microscopy ranging from 0-3 to 10-50 RBCs.  3 of the specimens are not considered reliable due to may need to moderate squamous epithelial cells present indicating vaginal contamination.  The urinalysis in December 2018 showed 10-50 RBCs and no significant epithelial cells.  She denies flank, abdominal or pelvic pain.  She denies gross hematuria.  She is completely asymptomatic and has no voiding symptoms.  A CT of the abdomen pelvis without contrast was performed on 05/02/2018 which showed a 4.3 cm hypodensity in the right kidney felt to represent a complex cyst versus contiguous cysts.  She has a known right adrenal adenoma which is stable.   PMH: Past Medical History:  Diagnosis Date  . Allergy   . Depression   . GERD (gastroesophageal reflux disease)   . Hypertension   . Nephrolithiasis   . Smell or taste problem   . Stomach ulcer     Surgical History: Past Surgical History:  Procedure Laterality Date  . CHOLECYSTECTOMY    . colonoscopy  2014  . KNEE SURGERY Right   . TENNIS ELBOW RELEASE/NIRSCHEL PROCEDURE      Home Medications:  Allergies as of 05/23/2018      Reactions   Ceftin [cefuroxime Axetil] Itching, Rash      Medication List        Accurate as of 05/23/18  2:44 PM. Always use your most recent med list.          ALPRAZolam 0.5 MG tablet Commonly known as:  XANAX Take 0.5 mg by mouth 2 (two) times daily as needed for anxiety.   aspirin 325 MG tablet Take by mouth.   b complex vitamins  capsule Take 1 capsule by mouth daily.   baclofen 10 MG tablet Commonly known as:  LIORESAL take 1/2-1 tablet by mouth twice a day if needed for headache  - ...  (REFER TO PRESCRIPTION NOTES).   butalbital-acetaminophen-caffeine 50-325-40-30 MG capsule Commonly known as:  FIORICET WITH CODEINE Take 1-2 capsules by mouth 2 (two) times daily as needed for headache.   calcium carbonate 1500 (600 Ca) MG Tabs tablet Commonly known as:  OSCAL Take 600 mg of elemental calcium by mouth daily.   cholecalciferol 1000 units tablet Commonly known as:  VITAMIN D Take 1,000 Units by mouth daily.   citalopram 20 MG tablet Commonly known as:  CELEXA   DULoxetine 60 MG capsule Commonly known as:  CYMBALTA Take 60 mg by mouth daily.   lisinopril 10 MG tablet Commonly known as:  PRINIVIL,ZESTRIL Take 10 mg by mouth at bedtime.   Melatonin 10 MG Tabs Take 10 mg by mouth at bedtime.   omeprazole 20 MG capsule Commonly known as:  PRILOSEC Take 20 mg by mouth 2 (two) times daily.   zonisamide 25 MG capsule Commonly known as:  ZONEGRAN take 4 capsules by mouth at bedtime       Allergies:  Allergies  Allergen Reactions  . Ceftin [Cefuroxime Axetil] Itching and Rash    Family History:  Family History  Problem Relation Age of Onset  . Breast cancer Mother 84  . Prostate cancer Father   . Ovarian cancer Sister     Social History:  reports that she has never smoked. She has never used smokeless tobacco. She reports that she does not drink alcohol. Her drug history is not on file.  ROS: UROLOGY Frequent Urination?: No Hard to postpone urination?: No Burning/pain with urination?: No Get up at night to urinate?: No Leakage of urine?: No Urine stream starts and stops?: No Trouble starting stream?: No Do you have to strain to urinate?: No Blood in urine?: Yes Urinary tract infection?: No Sexually transmitted disease?: No Injury to kidneys or bladder?: No Painful intercourse?:  No Weak stream?: No Currently pregnant?: No Vaginal bleeding?: No Last menstrual period?: n  Gastrointestinal Nausea?: No Vomiting?: No Indigestion/heartburn?: Yes Diarrhea?: No Constipation?: No  Constitutional Fever: No Night sweats?: Yes Weight loss?: No Fatigue?: No  Skin Skin rash/lesions?: No Itching?: No  Eyes Blurred vision?: No Double vision?: No  Ears/Nose/Throat Sore throat?: No Sinus problems?: No  Hematologic/Lymphatic Swollen glands?: Yes Easy bruising?: No  Cardiovascular Leg swelling?: No Chest pain?: No  Respiratory Cough?: No Shortness of breath?: No  Endocrine Excessive thirst?: No  Musculoskeletal Back pain?: No Joint pain?: Yes  Neurological Headaches?: No Dizziness?: Yes  Psychologic Depression?: Yes Anxiety?: No  Physical Exam: BP 132/77   Pulse 71   Resp 16   Ht 5\' 3"  (1.6 m)   Wt 148 lb 12.8 oz (67.5 kg)   SpO2 96%   BMI 26.36 kg/m   Constitutional:  Alert and oriented, No acute distress. HEENT: [place] AT, moist mucus membranes.  Trachea midline, no masses. Cardiovascular: No clubbing, cyanosis, or edema. Respiratory: Normal respiratory effort, no increased work of breathing. GI: Abdomen is soft, nontender, nondistended, no abdominal masses GU: No CVA tenderness Lymph: No cervical or inguinal lymphadenopathy. Skin: No rashes, bruises or suspicious lesions. Neurologic: Grossly intact, no focal deficits, moving all 4 extremities. Psychiatric: Normal mood and affect.  Laboratory Data:  Urinalysis Dipstick trace blood; microscopy negative RBCs  Pertinent Imaging: CT images were not available for review  Assessment & Plan:   62 year old female with microhematuria and possible complex renal cyst.  The findings were discussed with Phyllis Wright and the standard evaluation of microhematuria which would be a CT urogram and cystoscopy.  The CT urogram will further evaluate the cystic findings in the right kidney.  She has  elected to proceed with these studies.   Abbie Sons, [place] [address] [place], [place] [address] [phone]

## 2018-05-24 ENCOUNTER — Encounter: Payer: Self-pay | Admitting: Urology

## 2018-05-30 ENCOUNTER — Other Ambulatory Visit: Payer: Self-pay

## 2018-06-03 ENCOUNTER — Other Ambulatory Visit: Payer: Self-pay | Admitting: Urology

## 2018-07-02 ENCOUNTER — Ambulatory Visit (INDEPENDENT_AMBULATORY_CARE_PROVIDER_SITE_OTHER): Payer: BLUE CROSS/BLUE SHIELD | Admitting: Urology

## 2018-07-02 ENCOUNTER — Encounter: Payer: Self-pay | Admitting: Urology

## 2018-07-02 VITALS — BP 113/72 | HR 89 | Ht 63.0 in | Wt 149.0 lb

## 2018-07-02 DIAGNOSIS — R3129 Other microscopic hematuria: Secondary | ICD-10-CM

## 2018-07-02 LAB — URINALYSIS, COMPLETE
Bilirubin, UA: NEGATIVE
Glucose, UA: NEGATIVE
Ketones, UA: NEGATIVE
Leukocytes, UA: NEGATIVE
Nitrite, UA: NEGATIVE
PROTEIN UA: NEGATIVE
Specific Gravity, UA: <1.005 — ABNORMAL LOW (ref 1.005–1.030)
Urobilinogen, Ur: 0.2 mg/dL (ref 0.2–1.0)
pH, UA: 6.5 (ref 5.0–7.5)

## 2018-07-02 MED ORDER — DOXYCYCLINE HYCLATE 100 MG PO TABS: 100.0000 mg | ORAL_TABLET | Freq: Two times a day (BID) | ORAL | Status: DC

## 2018-07-02 MED ORDER — LIDOCAINE HCL URETHRAL/MUCOSAL 2 % EX GEL: 1.0000 "application " | Freq: Once | CUTANEOUS | Status: AC

## 2018-07-03 NOTE — Progress Notes (Signed)
   07/03/18  CC:  Chief Complaint  Patient presents with  . Cysto    HPI: 62 year old female with microhematuria.  She has a long history of a 3 cm right adrenal adenoma dating back to 2011.  A recent noncontrast CT June 2019 showed a 4.3 cm bilobed focus in the right kidney felt to represent a complex cyst or contiguous cysts.  CT urogram performed 05/29/2018 shows a stable right adrenal adenoma.  There was a septated cyst in the lower pole of the right kidney measuring 4.5 x 3.2 cm with some enhancement of the septation.  Blood pressure 113/72, pulse 89, height 5\' 3"  (1.6 m), weight 149 lb (67.6 kg). NED. A&Ox3.   No respiratory distress   Abd soft, NT, ND Normal external genitalia with patent urethral meatus  Cystoscopy Procedure Note  Patient identification was confirmed, informed consent was obtained, and patient was prepped using Betadine solution.  Lidocaine jelly was administered per urethral meatus.    Preoperative abx where received prior to procedure.    Procedure: - Flexible cystoscope introduced, without any difficulty.   - Thorough search of the bladder revealed:    normal urethral meatus    normal urothelium    no stones    no ulcers     no tumors    no urethral polyps    no trabeculation  - Ureteral orifices were normal in position and appearance.  Post-Procedure: - Patient tolerated the procedure well  Assessment/ Plan: 1.  No significant abnormalities on cystoscopy.  Urine cytology was sent  2.  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  Follow-up renal ultrasound 6 months.   Abbie Sons, MD

## 2018-07-09 ENCOUNTER — Other Ambulatory Visit: Payer: Self-pay | Admitting: Obstetrics and Gynecology

## 2018-07-09 DIAGNOSIS — Z1231 Encounter for screening mammogram for malignant neoplasm of breast: Secondary | ICD-10-CM

## 2018-07-10 ENCOUNTER — Other Ambulatory Visit: Payer: Self-pay | Admitting: Urology

## 2018-07-20 NOTE — Progress Notes (Signed)
Urine cytology showed no evidence of malignant cells. 

## 2018-07-22 NOTE — Progress Notes (Signed)
Patient notified

## 2018-07-25 ENCOUNTER — Ambulatory Visit
Admission: RE | Admit: 2018-07-25 | Discharge: 2018-07-25 | Disposition: A | Discharge status: Home / Self Care | Payer: BLUE CROSS/BLUE SHIELD | Source: Ambulatory Visit | Attending: Obstetrics and Gynecology | Admitting: Obstetrics and Gynecology

## 2018-07-25 DIAGNOSIS — Z1231 Encounter for screening mammogram for malignant neoplasm of breast: Secondary | ICD-10-CM | POA: Diagnosis not present

## 2019-01-03 ENCOUNTER — Ambulatory Visit (INDEPENDENT_AMBULATORY_CARE_PROVIDER_SITE_OTHER): Payer: PRIVATE HEALTH INSURANCE | Admitting: Urology

## 2019-01-03 ENCOUNTER — Encounter: Payer: Self-pay | Admitting: Urology

## 2019-01-03 VITALS — BP 110/69 | HR 76 | Ht 63.0 in | Wt 148.0 lb

## 2019-01-03 DIAGNOSIS — N281 Cyst of kidney, acquired: Secondary | ICD-10-CM | POA: Diagnosis not present

## 2019-01-03 NOTE — Progress Notes (Signed)
01/03/2019 10:00 AM   York Ram [birth year] [id number]  Referring provider: Adin Hector, MD [facility] [place], [place] [address]  Chief Complaint  Patient presents with  . Follow-up   Urologic history: 1.  Asymptomatic microhematuria  -Upper tract imaging; see below  -Cystoscopy 8/27 no abnormalities  -Urine cytology negative  2.  Bosniak 42F right renal cyst  -CT 05/2018; 3.2 x 4.5 cm septated right lower pole cyst with an enhancing septum   HPI: 63 year old female presents for six-month follow-up.  She has no bothersome lower urinary tract symptoms.  Denies gross hematuria.  Denies flank, abdominal or pelvic pain.  Urinalysis with her PCP December 2019 showed no significant microhematuria.    She has a 3 cm adrenal adenoma which has been stable since 2010.  She has not had follow-up imaging of her complex cyst.   PMH: Past Medical History:  Diagnosis Date  . Allergy   . Depression   . GERD (gastroesophageal reflux disease)   . Hypertension   . Nephrolithiasis   . Smell or taste problem   . Stomach ulcer     Surgical History: Past Surgical History:  Procedure Laterality Date  . CHOLECYSTECTOMY    . colonoscopy  2014  . KNEE SURGERY Right   . TENNIS ELBOW RELEASE/NIRSCHEL PROCEDURE      Home Medications:  Allergies as of 01/03/2019      Reactions   Ceftin [cefuroxime Axetil] Itching, Rash      Medication List       Accurate as of January 03, 2019 10:00 AM. Always use your most recent med list.        acetaminophen 325 MG tablet Commonly known as:  TYLENOL Take 325 mg by mouth as needed.   b complex vitamins capsule Take 1 capsule by mouth daily.   calcium carbonate 1500 (600 Ca) MG Tabs tablet Commonly known as:  OSCAL Take 600 mg of elemental calcium by mouth daily.   cholecalciferol 1000 units tablet Commonly known as:  VITAMIN D Take 1,000 Units by mouth daily.   citalopram 20 MG  tablet Commonly known as:  CELEXA   clonazePAM 0.5 MG tablet Commonly known as:  KLONOPIN Take 1/2-1 tab bid prn   IRON PO Take by mouth.   lisinopril 10 MG tablet Commonly known as:  PRINIVIL,ZESTRIL Take 10 mg by mouth at bedtime.   Melatonin 10 MG Tabs Take 10 mg by mouth at bedtime.   VITAMIN C PO Take by mouth.       Allergies:  Allergies  Allergen Reactions  . Ceftin [Cefuroxime Axetil] Itching and Rash    Family History: Family History  Problem Relation Age of Onset  . Breast cancer Mother 13  . Prostate cancer Father   . Ovarian cancer Sister     Social History:  reports that she has never smoked. She has never used smokeless tobacco. She reports that she does not drink alcohol or use drugs.  ROS: UROLOGY Frequent Urination?: No Hard to postpone urination?: No Burning/pain with urination?: No Get up at night to urinate?: No Leakage of urine?: No Urine stream starts and stops?: No Trouble starting stream?: No Do you have to strain to urinate?: No Blood in urine?: No Urinary tract infection?: No Sexually transmitted disease?: No Injury to kidneys or bladder?: No Painful intercourse?: No Weak stream?: No Currently pregnant?: No Vaginal bleeding?: No Last menstrual period?: Ablation  Gastrointestinal Nausea?: No Vomiting?: No Indigestion/heartburn?:  Yes Diarrhea?: No Constipation?: No  Constitutional Fever: No Night sweats?: No Weight loss?: No Fatigue?: No  Skin Skin rash/lesions?: No Itching?: No  Eyes Blurred vision?: No Double vision?: No  Ears/Nose/Throat Sore throat?: No Sinus problems?: No  Hematologic/Lymphatic Swollen glands?: No Easy bruising?: No  Cardiovascular Leg swelling?: No Chest pain?: No  Respiratory Cough?: No Shortness of breath?: No  Endocrine Excessive thirst?: No  Musculoskeletal Back pain?: No Joint pain?: Yes  Neurological Headaches?: Yes Dizziness?: No  Psychologic Depression?:  Yes Anxiety?: No  Physical Exam: BP 110/69 (BP Location: Left Arm, Patient Position: Sitting, Cuff Size: Normal)   Pulse 76   Ht 5\' 3"  (1.6 m)   Wt 148 lb (67.1 kg)   BMI 26.22 kg/m   Constitutional:  Alert and oriented, No acute distress. HEENT: [place] AT, moist mucus membranes.  Trachea midline, no masses. Cardiovascular: No clubbing, cyanosis, or edema. Respiratory: Normal respiratory effort, no increased work of breathing. Neurologic: Grossly intact, no focal deficits, moving all 4 extremities. Psychiatric: Normal mood and affect.   Assessment & Plan:   63 year old female with a Bosniak 64F right renal cyst.  Will schedule a renal ultrasound and she will be notified with results.  If no size increase would recommend a follow-up CT in 9 months.   Abbie Sons, [place] [address] [place], [place] [address] [phone]

## 2019-01-14 ENCOUNTER — Ambulatory Visit
Admission: RE | Admit: 2019-01-14 | Discharge: 2019-01-14 | Disposition: A | Discharge status: Home / Self Care | Payer: PRIVATE HEALTH INSURANCE | Source: Ambulatory Visit | Attending: Urology | Admitting: Urology

## 2019-01-14 ENCOUNTER — Other Ambulatory Visit: Payer: Self-pay

## 2019-01-14 DIAGNOSIS — N281 Cyst of kidney, acquired: Secondary | ICD-10-CM | POA: Insufficient documentation

## 2019-01-17 ENCOUNTER — Other Ambulatory Visit: Payer: Self-pay | Admitting: Urology

## 2019-01-17 ENCOUNTER — Telehealth: Payer: Self-pay

## 2019-01-17 DIAGNOSIS — N2889 Other specified disorders of kidney and ureter: Secondary | ICD-10-CM

## 2019-01-17 NOTE — Telephone Encounter (Signed)
Pt informed see mychart message

## 2019-01-17 NOTE — Telephone Encounter (Signed)
-----   Message from Abbie Sons, MD sent at 01/17/2019  7:51 AM EDT ----- Renal ultrasound shows a stable right renal cyst.  Recommend 60-month follow-up visit with CT.  Order was entered.

## 2019-01-22 ENCOUNTER — Other Ambulatory Visit: Payer: Self-pay | Admitting: Urology

## 2019-01-22 DIAGNOSIS — N2889 Other specified disorders of kidney and ureter: Secondary | ICD-10-CM

## 2019-01-27 ENCOUNTER — Other Ambulatory Visit: Payer: Self-pay

## 2019-01-27 DIAGNOSIS — N2889 Other specified disorders of kidney and ureter: Secondary | ICD-10-CM

## 2019-07-15 ENCOUNTER — Other Ambulatory Visit: Payer: Self-pay | Admitting: Obstetrics and Gynecology

## 2019-07-15 DIAGNOSIS — Z1231 Encounter for screening mammogram for malignant neoplasm of breast: Secondary | ICD-10-CM

## 2019-08-19 ENCOUNTER — Ambulatory Visit
Admission: RE | Admit: 2019-08-19 | Discharge: 2019-08-19 | Disposition: A | Discharge status: Home / Self Care | Payer: PRIVATE HEALTH INSURANCE | Source: Ambulatory Visit | Attending: Obstetrics and Gynecology | Admitting: Obstetrics and Gynecology

## 2019-08-19 DIAGNOSIS — Z1231 Encounter for screening mammogram for malignant neoplasm of breast: Secondary | ICD-10-CM | POA: Diagnosis present

## 2019-09-24 ENCOUNTER — Other Ambulatory Visit: Payer: Self-pay

## 2019-09-24 ENCOUNTER — Ambulatory Visit
Admission: RE | Admit: 2019-09-24 | Discharge: 2019-09-24 | Disposition: A | Discharge status: Home / Self Care | Payer: PRIVATE HEALTH INSURANCE | Source: Ambulatory Visit | Attending: Urology | Admitting: Urology

## 2019-09-24 ENCOUNTER — Encounter: Payer: Self-pay | Admitting: *Deleted

## 2019-09-24 DIAGNOSIS — N2889 Other specified disorders of kidney and ureter: Secondary | ICD-10-CM | POA: Diagnosis present

## 2019-09-26 ENCOUNTER — Other Ambulatory Visit: Payer: Self-pay

## 2019-09-26 ENCOUNTER — Other Ambulatory Visit
Admission: RE | Admit: 2019-09-26 | Discharge: 2019-09-26 | Disposition: A | Discharge status: Home / Self Care | Payer: PRIVATE HEALTH INSURANCE | Source: Ambulatory Visit | Attending: Ophthalmology | Admitting: Ophthalmology

## 2019-09-26 DIAGNOSIS — Z20828 Contact with and (suspected) exposure to other viral communicable diseases: Secondary | ICD-10-CM | POA: Diagnosis not present

## 2019-09-26 DIAGNOSIS — Z01812 Encounter for preprocedural laboratory examination: Secondary | ICD-10-CM | POA: Diagnosis present

## 2019-09-26 LAB — SARS CORONAVIRUS 2 (TAT 6-24 HRS): SARS Coronavirus 2: NEGATIVE

## 2019-09-26 NOTE — Discharge Instructions (Signed)

## 2019-09-29 ENCOUNTER — Telehealth: Payer: Self-pay

## 2019-09-29 DIAGNOSIS — N281 Cyst of kidney, acquired: Secondary | ICD-10-CM

## 2019-09-29 NOTE — Telephone Encounter (Signed)
-----   Message from Abbie Sons, MD sent at 09/28/2019  7:09 PM EST ----- CT showed a stable renal cyst without change and stable adrenal adenoma.  She has an appointment scheduled next month.  Since CT is stable you can give her the options of canceling the appointment, keeping the appointment or changing to a virtual visit.  Recommend a 1 year follow-up with renal ultrasound.

## 2019-09-29 NOTE — Telephone Encounter (Signed)
Called pt informed her of the information below. Pt gave verbal understanding and states that she would like to cancel appt and f/u next year. Order placed, appt made for next year.

## 2019-09-30 ENCOUNTER — Ambulatory Visit
Admission: RE | Admit: 2019-09-30 | Discharge: 2019-09-30 | Disposition: A | Discharge status: Home / Self Care | Payer: PRIVATE HEALTH INSURANCE | Attending: Ophthalmology | Admitting: Ophthalmology

## 2019-09-30 ENCOUNTER — Encounter
Admission: RE | Disposition: A | Discharge status: Home / Self Care | Payer: Self-pay | Source: Home / Self Care | Attending: Ophthalmology

## 2019-09-30 ENCOUNTER — Ambulatory Visit: Payer: PRIVATE HEALTH INSURANCE | Admitting: Anesthesiology

## 2019-09-30 ENCOUNTER — Other Ambulatory Visit: Payer: Self-pay

## 2019-09-30 DIAGNOSIS — F329 Major depressive disorder, single episode, unspecified: Secondary | ICD-10-CM | POA: Diagnosis not present

## 2019-09-30 DIAGNOSIS — I10 Essential (primary) hypertension: Secondary | ICD-10-CM | POA: Insufficient documentation

## 2019-09-30 DIAGNOSIS — H2511 Age-related nuclear cataract, right eye: Secondary | ICD-10-CM | POA: Diagnosis present

## 2019-09-30 DIAGNOSIS — F419 Anxiety disorder, unspecified: Secondary | ICD-10-CM | POA: Diagnosis not present

## 2019-09-30 DIAGNOSIS — E039 Hypothyroidism, unspecified: Secondary | ICD-10-CM | POA: Diagnosis not present

## 2019-09-30 DIAGNOSIS — K219 Gastro-esophageal reflux disease without esophagitis: Secondary | ICD-10-CM | POA: Diagnosis not present

## 2019-09-30 DIAGNOSIS — M1990 Unspecified osteoarthritis, unspecified site: Secondary | ICD-10-CM | POA: Insufficient documentation

## 2019-09-30 HISTORY — PX: CATARACT EXTRACTION W/PHACO: SHX586

## 2019-09-30 HISTORY — DX: Headache, unspecified: R51.9

## 2019-09-30 SURGERY — PHACOEMULSIFICATION, CATARACT, WITH IOL INSERTION
Anesthesia: Monitor Anesthesia Care | Site: Eye | Laterality: Right

## 2019-09-30 MED ORDER — LIDOCAINE HCL (PF) 2 % IJ SOLN
INTRAOCULAR | Status: DC | PRN
  Administered 2019-09-30: 1 mL

## 2019-09-30 MED ORDER — FENTANYL CITRATE (PF) 100 MCG/2ML IJ SOLN
INTRAMUSCULAR | Status: DC | PRN
  Administered 2019-09-30: 100 ug via INTRAVENOUS

## 2019-09-30 MED ORDER — TETRACAINE HCL 0.5 % OP SOLN
1.0000 [drp] | OPHTHALMIC | Status: DC | PRN
  Administered 2019-09-30 (×3): 1 [drp] via OPHTHALMIC

## 2019-09-30 MED ORDER — ARMC OPHTHALMIC DILATING DROPS
1.0000 "application " | OPHTHALMIC | Status: DC | PRN
  Administered 2019-09-30 (×3): 1 via OPHTHALMIC

## 2019-09-30 MED ORDER — ONDANSETRON HCL 4 MG/2ML IJ SOLN: 4.0000 mg | Freq: Once | INTRAMUSCULAR | Status: DC | PRN

## 2019-09-30 MED ORDER — MOXIFLOXACIN HCL 0.5 % OP SOLN
OPHTHALMIC | Status: DC | PRN
  Administered 2019-09-30: 0.2 mL via OPHTHALMIC

## 2019-09-30 MED ORDER — NA CHONDROIT SULF-NA HYALURON 40-17 MG/ML IO SOLN
INTRAOCULAR | Status: DC | PRN
  Administered 2019-09-30: 1 mL via INTRAOCULAR

## 2019-09-30 MED ORDER — ACETAMINOPHEN 160 MG/5ML PO SOLN: 325.0000 mg | ORAL | Status: DC | PRN

## 2019-09-30 MED ORDER — MIDAZOLAM HCL 2 MG/2ML IJ SOLN
INTRAMUSCULAR | Status: DC | PRN
  Administered 2019-09-30: 2 mg via INTRAVENOUS

## 2019-09-30 MED ORDER — EPINEPHRINE PF 1 MG/ML IJ SOLN
INTRAOCULAR | Status: DC | PRN
  Administered 2019-09-30: 43 mL via OPHTHALMIC

## 2019-09-30 MED ORDER — BRIMONIDINE TARTRATE-TIMOLOL 0.2-0.5 % OP SOLN
OPHTHALMIC | Status: DC | PRN
  Administered 2019-09-30: 1 [drp] via OPHTHALMIC

## 2019-09-30 MED ORDER — ACETAMINOPHEN 325 MG PO TABS: 325.0000 mg | ORAL_TABLET | ORAL | Status: DC | PRN

## 2019-09-30 SURGICAL SUPPLY — 18 items
CANNULA ANT/CHMB 27GA (MISCELLANEOUS) ×6 IMPLANT
GLOVE SURG LX 8.0 MICRO (GLOVE) ×2
GLOVE SURG LX STRL 8.0 MICRO (GLOVE) ×1 IMPLANT
GLOVE SURG TRIUMPH 8.0 PF LTX (GLOVE) ×3 IMPLANT
GOWN STRL REUS W/ TWL LRG LVL3 (GOWN DISPOSABLE) ×2 IMPLANT
GOWN STRL REUS W/TWL LRG LVL3 (GOWN DISPOSABLE) ×4
LENS IOL TECNIS ITEC 21.5 (Intraocular Lens) ×3 IMPLANT
MARKER SKIN DUAL TIP RULER LAB (MISCELLANEOUS) ×3 IMPLANT
NDL RETROBULBAR .5 NSTRL (NEEDLE) ×3 IMPLANT
NEEDLE FILTER BLUNT 18X 1/2SAF (NEEDLE) ×2
NEEDLE FILTER BLUNT 18X1 1/2 (NEEDLE) ×1 IMPLANT
PACK EYE AFTER SURG (MISCELLANEOUS) ×3 IMPLANT
PACK OPTHALMIC (MISCELLANEOUS) ×3 IMPLANT
PACK PORFILIO (MISCELLANEOUS) ×3 IMPLANT
SYR 3ML LL SCALE MARK (SYRINGE) ×3 IMPLANT
SYR TB 1ML LUER SLIP (SYRINGE) ×3 IMPLANT
WATER STERILE IRR 250ML POUR (IV SOLUTION) ×3 IMPLANT
WIPE NON LINTING 3.25X3.25 (MISCELLANEOUS) ×3 IMPLANT

## 2019-09-30 NOTE — H&P (Signed)
All labs reviewed. Abnormal studies sent to patients PCP when indicated.  Previous H&P reviewed, patient examined, there are NO CHANGES.  Phyllis Dooling Porfilio11/24/20208:56 AM

## 2019-09-30 NOTE — Op Note (Signed)
PREOPERATIVE DIAGNOSIS:  Nuclear sclerotic cataract of the right eye.   POSTOPERATIVE DIAGNOSIS:  H25.11 Cataract   OPERATIVE PROCEDURE:@   SURGEON:  Birder Robson, MD.   ANESTHESIA:  Anesthesiologist: Sinda Du, MD CRNA: Mayme Genta, CRNA  1.      Managed anesthesia care. 2.      0.78ml of Shugarcaine was instilled in the eye following the paracentesis.   COMPLICATIONS:  None.   TECHNIQUE:   Stop and chop   DESCRIPTION OF PROCEDURE:  The patient was examined and consented in the preoperative holding area where the aforementioned topical anesthesia was applied to the right eye and then brought back to the Operating Room where the right eye was prepped and draped in the usual sterile ophthalmic fashion and a lid speculum was placed. A paracentesis was created with the side port blade and the anterior chamber was filled with viscoelastic. A near clear corneal incision was performed with the steel keratome. A continuous curvilinear capsulorrhexis was performed with a cystotome followed by the capsulorrhexis forceps. Hydrodissection and hydrodelineation were carried out with BSS on a blunt cannula. The lens was removed in a stop and chop  technique and the remaining cortical material was removed with the irrigation-aspiration handpiece. The capsular bag was inflated with viscoelastic and the Technis [id number]  lens was placed in the capsular bag without complication. The remaining viscoelastic was removed from the eye with the irrigation-aspiration handpiece. The wounds were hydrated. The anterior chamber was flushed with BSS and the eye was inflated to physiologic pressure. 0.54ml of Vigamox was placed in the anterior chamber. The wounds were found to be water tight. The eye was dressed with Combigan. The patient was given protective glasses to wear throughout the day and a shield with which to sleep tonight. The patient was also given drops with which to begin a drop regimen today and will  follow-up with me in one day. Implant Name Type Inv. Item Serial No. Manufacturer Lot No. LRB No. Used Action  LENS IOL DIOP 21.5 - [id number] Intraocular Lens LENS IOL DIOP 21.5 [id number]  Right 1 Implanted   Procedure(s): CATARACT EXTRACTION PHACO AND INTRAOCULAR LENS PLACEMENT (IOC) RIGHT 6.46  00:38.3 (Right)  Electronically signed: Birder Robson 09/30/2019 9:32 AM

## 2019-09-30 NOTE — Anesthesia Procedure Notes (Signed)
Procedure Name: MAC Performed by: Slayton Lubitz, CRNA Pre-anesthesia Checklist: Patient identified, Emergency Drugs available, Suction available, Timeout performed and Patient being monitored Patient Re-evaluated:Patient Re-evaluated prior to induction Oxygen Delivery Method: Nasal cannula Placement Confirmation: positive ETCO2       

## 2019-09-30 NOTE — Anesthesia Preprocedure Evaluation (Signed)
Anesthesia Evaluation  Patient identified by MRN, date of birth, ID band Patient awake    Reviewed: Allergy & Precautions, NPO status , Patient's Chart, lab work & pertinent test results, reviewed documented beta blocker date and time   History of Anesthesia Complications Negative for: history of anesthetic complications  Airway Mallampati: II  TM Distance: >3 FB Neck ROM: Full    Dental no notable dental hx.    Pulmonary    Pulmonary exam normal breath sounds clear to auscultation       Cardiovascular Exercise Tolerance: Good hypertension, Normal cardiovascular exam Rhythm:Regular Rate:Normal     Neuro/Psych  Headaches, PSYCHIATRIC DISORDERS Anxiety Depression    GI/Hepatic PUD, GERD  ,  Endo/Other  Hypothyroidism   Renal/GU Renal disease  negative genitourinary   Musculoskeletal  (+) Arthritis ,   Abdominal Normal abdominal exam  (+) - obese,   Peds negative pediatric ROS (+)  Hematology negative hematology ROS (+)   Anesthesia Other Findings   Reproductive/Obstetrics negative OB ROS                             Anesthesia Physical Anesthesia Plan  ASA: III  Anesthesia Plan: MAC   Post-op Pain Management:    Induction: Intravenous  PONV Risk Score and Plan: 2 and Treatment may vary due to age or medical condition  Airway Management Planned: Nasal Cannula  Additional Equipment:   Intra-op Plan:   Post-operative Plan:   Informed Consent: I have reviewed the patients History and Physical, chart, labs and discussed the procedure including the risks, benefits and alternatives for the proposed anesthesia with the patient or authorized representative who has indicated his/her understanding and acceptance.     Dental advisory given  Plan Discussed with: CRNA  Anesthesia Plan Comments:         Anesthesia Quick Evaluation  Patient Active Problem List   Diagnosis  Date Noted  . 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 Polycystic ovary disease 05/23/2018  . Hypertension 05/23/2018  . Depression 05/23/2018  . Anxiety 05/23/2018  . 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 Seasonal allergies 05/23/2018  . Hematuria, microscopic 05/16/2018  . Lipoma of lower back 08/15/2016  . Elbow tendinitis 04/15/2015  . Hyperglycemia, unspecified 03/16/2015  . Episodic tension-type headache, not intractable 12/29/2014  . Ovarian cyst 01/15/2014  . Hypothyroidism, unspecified 01/15/2014  . Esophageal reflux 01/15/2014  . Vitamin D deficiency 01/15/2014    CBC Latest Ref Rng & Units 12/24/2015  WBC 3.6 - 11.0 K/uL 8.4  Hemoglobin 12.0 - 16.0 g/dL 13.1  Hematocrit 35.0 - 47.0 % 39.4  Platelets 150 - 440 K/uL 256   BMP Latest Ref Rng & Units 12/24/2015  Glucose 65 - 99 mg/dL 106(H)  BUN 6 - 20 mg/dL 17  Creatinine 0.44 - 1.00 mg/dL 0.93  Sodium 135 - 145 mmol/L 139  Potassium 3.5 - 5.1 mmol/L 3.9  Chloride 101 - 111 mmol/L 106  CO2 22 - 32 mmol/L 27  Calcium 8.9 - 10.3 mg/dL 9.0    Risks and benefits of anesthesia discussed at length, patient or surrogate demonstrates understanding. Appropriately NPO. Plan to proceed with anesthesia.  Champ Mungo, MD 09/30/19

## 2019-09-30 NOTE — Transfer of Care (Signed)
Immediate Anesthesia Transfer of Care Note  Patient: Phyllis Wright  Procedure(s) Performed: CATARACT EXTRACTION PHACO AND INTRAOCULAR LENS PLACEMENT (IOC) RIGHT 6.46  00:38.3 (Right Eye)  Patient Location: PACU  Anesthesia Type: MAC  Level of Consciousness: awake, alert  and patient cooperative  Airway and Oxygen Therapy: Patient Spontanous Breathing and Patient connected to supplemental oxygen  Post-op Assessment: Post-op Vital signs reviewed, Patient's Cardiovascular Status Stable, Respiratory Function Stable, Patent Airway and No signs of Nausea or vomiting  Post-op Vital Signs: Reviewed and stable  Complications: No apparent anesthesia complications

## 2019-09-30 NOTE — Anesthesia Postprocedure Evaluation (Signed)
Anesthesia Post Note  Patient: Phyllis Wright  Procedure(s) Performed: CATARACT EXTRACTION PHACO AND INTRAOCULAR LENS PLACEMENT (IOC) RIGHT 6.46  00:38.3 (Right Eye)     Anesthesia Post Evaluation  Sinda Du

## 2019-10-01 ENCOUNTER — Encounter: Payer: Self-pay | Admitting: Ophthalmology

## 2019-10-23 ENCOUNTER — Other Ambulatory Visit: Payer: Self-pay | Admitting: Podiatry

## 2019-10-23 ENCOUNTER — Ambulatory Visit: Payer: Self-pay | Admitting: Urology

## 2019-10-27 ENCOUNTER — Encounter: Payer: Self-pay | Admitting: Podiatry

## 2019-10-27 ENCOUNTER — Other Ambulatory Visit: Payer: Self-pay

## 2019-10-28 NOTE — Discharge Instructions (Signed)
[place] DR. TROXLER, DR. Vickki Muff, AND DR. [place]   1. Take your medication as prescribed.  Pain medication should be taken only as needed.  2. Keep the dressing clean, dry and intact.  3. Keep your foot elevated above the heart level for the first 48 hours.  4. Walking to the bathroom and brief periods of walking are acceptable, unless we have instructed you to be non-weight bearing.  5. Always wear your post-op shoe when walking.  Always use your crutches if you are to be non-weight bearing.  6. Do not take a shower. Baths are permissible as long as the foot is kept out of the water.   7. Every hour you are awake:  - Bend your knee 15 times. - Flex foot 15 times - Massage calf 15 times  8. Call [facility] [phone]) if any of the following problems occur: - You develop a temperature or fever. - The bandage becomes saturated with blood. - Medication does not stop your pain. - Injury of the foot occurs. - Any symptoms of infection including redness, odor, or red streaks running from wound.   General Anesthesia, Adult, Care After This sheet gives you information about how to care for yourself after your procedure. Your health care provider may also give you more specific instructions. If you have problems or questions, contact your health care provider. What can I expect after the procedure? After the procedure, the following side effects are common:  Pain or discomfort at the IV site.  Nausea.  Vomiting.  Sore throat.  Trouble concentrating.  Feeling cold or chills.  Weak or tired.  Sleepiness and fatigue.  Soreness and body aches. These side effects can affect parts of the body that were not involved in surgery. Follow these instructions at home:  For at least 24 hours after the procedure:  Have a responsible adult stay with you. It is  important to have someone help care for you until you are awake and alert.  Rest as needed.  Do not: ? Participate in activities in which you could fall or become injured. ? Drive. ? Use heavy machinery. ? Drink alcohol. ? Take sleeping pills or medicines that cause drowsiness. ? Make important decisions or sign legal documents. ? Take care of children on your own. Eating and drinking  Follow any instructions from your health care provider about eating or drinking restrictions.  When you feel hungry, start by eating small amounts of foods that are soft and easy to digest (bland), such as toast. Gradually return to your regular diet.  Drink enough fluid to keep your urine pale yellow.  If you vomit, rehydrate by drinking water, juice, or clear broth. General instructions  If you have sleep apnea, surgery and certain medicines can increase your risk for breathing problems. Follow instructions from your health care provider about wearing your sleep device: ? Anytime you are sleeping, including during daytime naps. ? While taking prescription pain medicines, sleeping medicines, or medicines that make you drowsy.  Return to your normal activities as told by your health care provider. Ask your health care provider what activities are safe for you.  Take over-the-counter and prescription medicines only as told by your health care provider.  If you smoke, do not smoke without supervision.  Keep all follow-up visits as told by your health care provider. This is important. Contact a health care provider if:  You have nausea or vomiting that does not get better with medicine.  You cannot eat or drink without vomiting.  You have pain that does not get better with medicine.  You are unable to pass urine.  You develop a skin rash.  You have a fever.  You have redness around your IV site that gets worse. Get help right away if:  You have difficulty breathing.  You have chest  pain.  You have blood in your urine or stool, or you vomit blood. Summary  After the procedure, it is common to have a sore throat or nausea. It is also common to feel tired.  Have a responsible adult stay with you for the first 24 hours after general anesthesia. It is important to have someone help care for you until you are awake and alert.  When you feel hungry, start by eating small amounts of foods that are soft and easy to digest (bland), such as toast. Gradually return to your regular diet.  Drink enough fluid to keep your urine pale yellow.  Return to your normal activities as told by your health care provider. Ask your health care provider what activities are safe for you. This information is not intended to replace advice given to you by your health care provider. Make sure you discuss any questions you have with your health care provider. Document Released: 01/29/2001 Document Revised: 10/26/2017 Document Reviewed: 06/08/2017 Elsevier Patient Education  2020 [employer].

## 2019-11-03 ENCOUNTER — Other Ambulatory Visit: Payer: Self-pay

## 2019-11-03 ENCOUNTER — Other Ambulatory Visit
Admission: RE | Admit: 2019-11-03 | Discharge: 2019-11-03 | Disposition: A | Discharge status: Home / Self Care | Payer: PRIVATE HEALTH INSURANCE | Source: Ambulatory Visit | Attending: Podiatry | Admitting: Podiatry

## 2019-11-03 DIAGNOSIS — Z01812 Encounter for preprocedural laboratory examination: Secondary | ICD-10-CM | POA: Insufficient documentation

## 2019-11-03 DIAGNOSIS — Z20828 Contact with and (suspected) exposure to other viral communicable diseases: Secondary | ICD-10-CM | POA: Insufficient documentation

## 2019-11-04 LAB — SARS CORONAVIRUS 2 (TAT 6-24 HRS): SARS Coronavirus 2: NEGATIVE

## 2019-11-05 ENCOUNTER — Encounter
Admission: RE | Disposition: A | Discharge status: Home / Self Care | Payer: Self-pay | Source: Home / Self Care | Attending: Podiatry

## 2019-11-05 ENCOUNTER — Ambulatory Visit: Payer: Self-pay | Admitting: Anesthesiology

## 2019-11-05 ENCOUNTER — Ambulatory Visit
Admission: RE | Admit: 2019-11-05 | Discharge: 2019-11-05 | Disposition: A | Discharge status: Home / Self Care | Payer: PRIVATE HEALTH INSURANCE | Attending: Podiatry | Admitting: Podiatry

## 2019-11-05 ENCOUNTER — Encounter: Payer: Self-pay | Admitting: Podiatry

## 2019-11-05 ENCOUNTER — Encounter: Payer: Self-pay | Admitting: Anesthesiology

## 2019-11-05 ENCOUNTER — Other Ambulatory Visit: Payer: Self-pay

## 2019-11-05 DIAGNOSIS — I10 Essential (primary) hypertension: Secondary | ICD-10-CM | POA: Insufficient documentation

## 2019-11-05 DIAGNOSIS — G5761 Lesion of plantar nerve, right lower limb: Secondary | ICD-10-CM | POA: Diagnosis not present

## 2019-11-05 DIAGNOSIS — Z79899 Other long term (current) drug therapy: Secondary | ICD-10-CM | POA: Diagnosis not present

## 2019-11-05 DIAGNOSIS — K219 Gastro-esophageal reflux disease without esophagitis: Secondary | ICD-10-CM | POA: Insufficient documentation

## 2019-11-05 DIAGNOSIS — K279 Peptic ulcer, site unspecified, unspecified as acute or chronic, without hemorrhage or perforation: Secondary | ICD-10-CM | POA: Insufficient documentation

## 2019-11-05 DIAGNOSIS — E039 Hypothyroidism, unspecified: Secondary | ICD-10-CM | POA: Diagnosis not present

## 2019-11-05 DIAGNOSIS — F418 Other specified anxiety disorders: Secondary | ICD-10-CM | POA: Diagnosis not present

## 2019-11-05 HISTORY — PX: EXCISION MORTON'S NEUROMA: SHX5013

## 2019-11-05 SURGERY — EXCISION, MORTON'S NEUROMA
Anesthesia: General | Laterality: Right

## 2019-11-05 MED ORDER — LIDOCAINE HCL (CARDIAC) PF 100 MG/5ML IV SOSY
PREFILLED_SYRINGE | INTRAVENOUS | Status: DC | PRN
  Administered 2019-11-05: 60 mg via INTRAVENOUS

## 2019-11-05 MED ORDER — DEXAMETHASONE SODIUM PHOSPHATE 4 MG/ML IJ SOLN
INTRAMUSCULAR | Status: DC | PRN
  Administered 2019-11-05: 10 mg via INTRAVENOUS

## 2019-11-05 MED ORDER — BUPIVACAINE HCL (PF) 0.5 % IJ SOLN
INTRAMUSCULAR | Status: AC
  Filled 2019-11-05: qty 30

## 2019-11-05 MED ORDER — LIDOCAINE HCL (PF) 2 % IJ SOLN
INTRAMUSCULAR | Status: AC
  Filled 2019-11-05: qty 10

## 2019-11-05 MED ORDER — BUPIVACAINE LIPOSOME 1.3 % IJ SUSP
INTRAMUSCULAR | Status: AC
  Filled 2019-11-05: qty 20

## 2019-11-05 MED ORDER — ONDANSETRON HCL 4 MG/2ML IJ SOLN
INTRAMUSCULAR | Status: AC
  Filled 2019-11-05: qty 2

## 2019-11-05 MED ORDER — PROPOFOL 10 MG/ML IV BOLUS
INTRAVENOUS | Status: AC
  Filled 2019-11-05: qty 20

## 2019-11-05 MED ORDER — LIDOCAINE HCL URETHRAL/MUCOSAL 2 % EX GEL
CUTANEOUS | Status: DC | PRN
  Administered 2019-11-05: 1 via TOPICAL

## 2019-11-05 MED ORDER — BUPIVACAINE HCL (PF) 0.5 % IJ SOLN
INTRAMUSCULAR | Status: DC | PRN
  Administered 2019-11-05: 4 mL
  Administered 2019-11-05: 15 mL

## 2019-11-05 MED ORDER — FENTANYL CITRATE (PF) 100 MCG/2ML IJ SOLN
INTRAMUSCULAR | Status: AC
  Filled 2019-11-05: qty 2

## 2019-11-05 MED ORDER — CLINDAMYCIN PHOSPHATE 900 MG/50ML IV SOLN
900.0000 mg | INTRAVENOUS | Status: AC
  Administered 2019-11-05: 13:00:00 900 mg via INTRAVENOUS

## 2019-11-05 MED ORDER — LIDOCAINE-EPINEPHRINE 1 %-1:100000 IJ SOLN
INTRAMUSCULAR | Status: DC | PRN
  Administered 2019-11-05: 4 mL

## 2019-11-05 MED ORDER — LIDOCAINE HCL (PF) 1 % IJ SOLN
INTRAMUSCULAR | Status: AC
  Filled 2019-11-05: qty 30

## 2019-11-05 MED ORDER — PROPOFOL 500 MG/50ML IV EMUL
INTRAVENOUS | Status: DC | PRN
  Administered 2019-11-05: 100 ug/kg/min via INTRAVENOUS

## 2019-11-05 MED ORDER — PROPOFOL 10 MG/ML IV BOLUS
INTRAVENOUS | Status: DC | PRN
  Administered 2019-11-05 (×2): 20 mg via INTRAVENOUS
  Administered 2019-11-05: 50 mg via INTRAVENOUS
  Administered 2019-11-05: 30 mg via INTRAVENOUS

## 2019-11-05 MED ORDER — PROPOFOL 500 MG/50ML IV EMUL
INTRAVENOUS | Status: AC
  Filled 2019-11-05: qty 50

## 2019-11-05 MED ORDER — LIDOCAINE-EPINEPHRINE 1 %-1:100000 IJ SOLN
INTRAMUSCULAR | Status: AC
  Filled 2019-11-05: qty 1

## 2019-11-05 MED ORDER — HYDROCODONE-ACETAMINOPHEN 5-325 MG PO TABS: 1.0000 | ORAL_TABLET | Freq: Four times a day (QID) | ORAL | 0 refills | Status: AC | PRN

## 2019-11-05 MED ORDER — DEXAMETHASONE SODIUM PHOSPHATE 10 MG/ML IJ SOLN
INTRAMUSCULAR | Status: AC
  Filled 2019-11-05: qty 1

## 2019-11-05 MED ORDER — LACTATED RINGERS IV SOLN
INTRAVENOUS | Status: DC
  Administered 2019-11-05: 13:00:00 50 mL/h via INTRAVENOUS

## 2019-11-05 MED ORDER — FENTANYL CITRATE (PF) 100 MCG/2ML IJ SOLN
INTRAMUSCULAR | Status: DC | PRN
  Administered 2019-11-05 (×2): 25 ug via INTRAVENOUS
  Administered 2019-11-05: 50 ug via INTRAVENOUS

## 2019-11-05 MED ORDER — ONDANSETRON HCL 4 MG/2ML IJ SOLN: 4.0000 mg | Freq: Once | INTRAMUSCULAR | Status: DC | PRN

## 2019-11-05 MED ORDER — ONDANSETRON HCL 4 MG/2ML IJ SOLN
INTRAMUSCULAR | Status: DC | PRN
  Administered 2019-11-05: 4 mg via INTRAVENOUS

## 2019-11-05 MED ORDER — FENTANYL CITRATE (PF) 100 MCG/2ML IJ SOLN: 25.0000 ug | INTRAMUSCULAR | Status: DC | PRN

## 2019-11-05 MED ORDER — MIDAZOLAM HCL 2 MG/2ML IJ SOLN
INTRAMUSCULAR | Status: DC | PRN
  Administered 2019-11-05: 2 mg via INTRAVENOUS

## 2019-11-05 MED ORDER — POVIDONE-IODINE 7.5 % EX SOLN
Freq: Once | CUTANEOUS | Status: AC
  Filled 2019-11-05: qty 118

## 2019-11-05 MED ORDER — CLINDAMYCIN PHOSPHATE 900 MG/50ML IV SOLN
INTRAVENOUS | Status: AC
  Filled 2019-11-05: qty 50

## 2019-11-05 MED ORDER — MIDAZOLAM HCL 2 MG/2ML IJ SOLN
INTRAMUSCULAR | Status: AC
  Filled 2019-11-05: qty 2

## 2019-11-05 MED ORDER — DEXAMETHASONE SODIUM PHOSPHATE 4 MG/ML IJ SOLN
INTRAMUSCULAR | Status: AC
  Filled 2019-11-05: qty 1

## 2019-11-05 SURGICAL SUPPLY — 34 items
BNDG CMPR STD VLCR NS LF 5.8X4 (GAUZE/BANDAGES/DRESSINGS)
BNDG CONFORM 2 STRL LF (GAUZE/BANDAGES/DRESSINGS) ×2 IMPLANT
BNDG CONFORM 3 STRL LF (GAUZE/BANDAGES/DRESSINGS) IMPLANT
BNDG ELASTIC 2X5.8 VLCR STR LF (GAUZE/BANDAGES/DRESSINGS) ×2 IMPLANT
BNDG ELASTIC 4X5.8 VLCR NS LF (GAUZE/BANDAGES/DRESSINGS) IMPLANT
BNDG ESMARK 4X12 TAN STRL LF (GAUZE/BANDAGES/DRESSINGS) ×2 IMPLANT
BNDG GAUZE 4.5X4.1 6PLY STRL (MISCELLANEOUS) IMPLANT
CANISTER SUCT 1200ML W/VALVE (MISCELLANEOUS) ×2 IMPLANT
COVER WAND RF STERILE (DRAPES) IMPLANT
CUFF TOURN SGL QUICK 12 (TOURNIQUET CUFF) ×2 IMPLANT
CUFF TOURN SGL QUICK 18X4 (TOURNIQUET CUFF) IMPLANT
DURAPREP 26ML APPLICATOR (WOUND CARE) ×2 IMPLANT
ELECT REM PT RETURN 9FT ADLT (ELECTROSURGICAL) ×2
ELECTRODE REM PT RTRN 9FT ADLT (ELECTROSURGICAL) ×1 IMPLANT
GAUZE SPONGE 4X4 12PLY STRL (GAUZE/BANDAGES/DRESSINGS) ×2 IMPLANT
GAUZE XEROFORM 1X8 LF (GAUZE/BANDAGES/DRESSINGS) ×2 IMPLANT
GLOVE BIO SURGEON STRL SZ7.5 (GLOVE) ×4 IMPLANT
GLOVE INDICATOR 8.0 STRL GRN (GLOVE) ×4 IMPLANT
GOWN STRL REUS W/ TWL LRG LVL3 (GOWN DISPOSABLE) ×2 IMPLANT
GOWN STRL REUS W/TWL LRG LVL3 (GOWN DISPOSABLE) ×2
LABEL OR SOLS (LABEL) ×2 IMPLANT
NEEDLE FILTER BLUNT 18X 1/2SAF (NEEDLE) ×1
NEEDLE FILTER BLUNT 18X1 1/2 (NEEDLE) ×1 IMPLANT
NEEDLE HYPO 25X1 1.5 SAFETY (NEEDLE) ×4 IMPLANT
NS IRRIG 500ML POUR BTL (IV SOLUTION) ×2 IMPLANT
PACK EXTREMITY ARMC (MISCELLANEOUS) ×2 IMPLANT
PENCIL ELECTRO HAND CTR (MISCELLANEOUS) ×2 IMPLANT
STOCKINETTE M/LG 89821 (MISCELLANEOUS) ×2 IMPLANT
STRIP CLOSURE SKIN 1/4X4 (GAUZE/BANDAGES/DRESSINGS) ×2 IMPLANT
SUT MNCRL+ 5-0 UNDYED PC-3 (SUTURE) ×1 IMPLANT
SUT MONOCRYL 5-0 (SUTURE) ×1
SUT VIC AB 4-0 FS2 27 (SUTURE) ×2 IMPLANT
SWABSTK COMLB BENZOIN TINCTURE (MISCELLANEOUS) IMPLANT
SYR 10ML LL (SYRINGE) ×4 IMPLANT

## 2019-11-05 NOTE — Transfer of Care (Signed)
Immediate Anesthesia Transfer of Care Note  Patient: Phyllis Wright  Procedure(s) Performed: EXCISION MORTON'S NEUROMA (Right )  Patient Location: PACU  Anesthesia Type:MAC  Level of Consciousness: awake, alert , oriented and patient cooperative  Airway & Oxygen Therapy: Patient Spontanous Breathing and Patient connected to face mask oxygen  Post-op Assessment: Report given to RN and Post -op Vital signs reviewed and stable  Post vital signs: Reviewed and stable  Last Vitals:  Vitals Value Taken Time  BP 108/61 11/05/19 1416  Temp    Pulse 67 11/05/19 1417  Resp 18 11/05/19 1417  SpO2 100 % 11/05/19 1417  Vitals shown include unvalidated device data.  Last Pain:  Vitals:   11/05/19 1056  TempSrc: Tympanic  PainSc: 0-No pain         Complications: No apparent anesthesia complications

## 2019-11-05 NOTE — Anesthesia Preprocedure Evaluation (Addendum)
Anesthesia Evaluation  Patient identified by MRN, date of birth, ID band Patient awake    Reviewed: Allergy & Precautions, NPO status , Patient's Chart, lab work & pertinent test results, reviewed documented beta blocker date and time   Airway Mallampati: II  TM Distance: >3 FB     Dental  (+) Chipped   Pulmonary           Cardiovascular hypertension, Pt. on medications      Neuro/Psych  Headaches, PSYCHIATRIC DISORDERS Anxiety Depression    GI/Hepatic PUD, GERD  Controlled,  Endo/Other  Hypothyroidism   Renal/GU Renal disease     Musculoskeletal   Abdominal   Peds  Hematology   Anesthesia Other Findings   Reproductive/Obstetrics                             Anesthesia Physical Anesthesia Plan  ASA: III  Anesthesia Plan: MAC   Post-op Pain Management:    Induction: Intravenous  PONV Risk Score and Plan:   Airway Management Planned:   Additional Equipment:   Intra-op Plan:   Post-operative Plan:   Informed Consent: I have reviewed the patients History and Physical, chart, labs and discussed the procedure including the risks, benefits and alternatives for the proposed anesthesia with the patient or authorized representative who has indicated his/her understanding and acceptance.       Plan Discussed with: CRNA  Anesthesia Plan Comments:        Anesthesia Quick Evaluation

## 2019-11-05 NOTE — Op Note (Signed)
Operative note   Surgeon:Lashayla Armes    Assistant:Lucy Sepic PA-student    Preop diagnosis: Right second webspace neuroma    Postop diagnosis: Same    Procedure: Excision neuroma right second webspace    EBL: Minimal    Anesthesia:local and IV sedation.  Local consisted of a one-to-one mixture of 0.5% bupivacaine and 1% lidocaine with epinephrine infiltrated along the incision site initially.  10 cc was used initially.  Further local anesthetic intraoperatively of 10 cc of Marcaine was used.    Hemostasis: Epinephrine infiltrated along the incision site    Specimen: Neuroma right second webspace    Complications: None    Operative indications:Phyllis Wright is an 63 y.o. that presents today for surgical intervention.  The risks/benefits/alternatives/complications have been discussed and consent has been given.    Procedure:  Patient was brought into the OR and placed on the operating table in thesupine position. After anesthesia was obtained theright lower extremity was prepped and draped in usual sterile fashion.  Attention was directed to the right second intermetatarsal space and webspace where a webspace splitting incision was performed.  Sharp and blunt dissection carried down to the DTI L.  This was then transected.  Further dissection was taken into the webspace.  At this time the arms of the neuroma that were entering the second and third toes were noted and transected with a 15 blade and held with mosquito hemostat.  This was then dissected back proximal to the metatarsophalangeal joint region.  This was then excised and sent for pathological examination.  All bleeders were Bovie cauterized as needed.  The wound was flushed with copious amounts of irrigation.  Subcutaneous tissue was reapproximated with a 3-0 Vicryl and the skin reapproximated with a 5-0 Monocryl.  The area was then infiltrated with 4 mg of dexamethasone.  A bulky sterile dressing was then applied.    Patient  tolerated the procedure and anesthesia well.  Was transported from the OR to the PACU with all vital signs stable and vascular status intact. To be discharged per routine protocol.  Will follow up in approximately 1 week in the outpatient clinic.

## 2019-11-05 NOTE — Anesthesia Post-op Follow-up Note (Signed)
Anesthesia QCDR form completed.        

## 2019-11-05 NOTE — H&P (Signed)
HISTORY AND PHYSICAL INTERVAL NOTE:  11/05/2019  12:57 PM  Phyllis Wright  has presented today for surgery, with the diagnosis of G57.61 LESION OF RIGHT PLANTAR NERVE.  The various methods of treatment have been discussed with the patient.  No guarantees were given.  After consideration of risks, benefits and other options for treatment, the patient has consented to surgery.  I have reviewed the patients' chart and labs.     A history and physical examination was performed in my office.  The patient was reexamined.  There have been no changes to this history and physical examination.  Phyllis Wright A

## 2019-11-05 NOTE — Anesthesia Postprocedure Evaluation (Signed)
Anesthesia Post Note  Patient: Phyllis Wright  Procedure(s) Performed: EXCISION MORTON'S NEUROMA (Right )  Patient location during evaluation: PACU Anesthesia Type: General Level of consciousness: awake and alert Pain management: pain level controlled Vital Signs Assessment: post-procedure vital signs reviewed and stable Respiratory status: spontaneous breathing, nonlabored ventilation, respiratory function stable and patient connected to nasal cannula oxygen Cardiovascular status: blood pressure returned to baseline and stable Postop Assessment: no apparent nausea or vomiting Anesthetic complications: no     Last Vitals:  Vitals:   11/05/19 1455 11/05/19 1505  BP: 115/68 126/63  Pulse: 66 67  Resp: 18 18  Temp: (!) 36.3 C 36.4 C  SpO2: 99% 97%    Last Pain:  Vitals:   11/05/19 1505  TempSrc: Temporal  PainSc: 0-No pain                 [name] S

## 2019-11-10 LAB — SURGICAL PATHOLOGY

## 2020-07-15 ENCOUNTER — Other Ambulatory Visit: Payer: Self-pay | Admitting: Obstetrics and Gynecology

## 2020-07-15 DIAGNOSIS — Z1231 Encounter for screening mammogram for malignant neoplasm of breast: Secondary | ICD-10-CM

## 2020-08-19 ENCOUNTER — Encounter: Payer: Self-pay | Admitting: Urology

## 2020-08-20 ENCOUNTER — Other Ambulatory Visit: Payer: Self-pay | Admitting: Urology

## 2020-08-20 ENCOUNTER — Ambulatory Visit
Admission: RE | Admit: 2020-08-20 | Discharge: 2020-08-20 | Disposition: A | Discharge status: Home / Self Care | Payer: PRIVATE HEALTH INSURANCE | Source: Ambulatory Visit | Attending: Obstetrics and Gynecology | Admitting: Obstetrics and Gynecology

## 2020-08-20 ENCOUNTER — Other Ambulatory Visit: Payer: Self-pay

## 2020-08-20 DIAGNOSIS — R3129 Other microscopic hematuria: Secondary | ICD-10-CM

## 2020-08-20 DIAGNOSIS — Z1231 Encounter for screening mammogram for malignant neoplasm of breast: Secondary | ICD-10-CM | POA: Insufficient documentation

## 2020-09-10 ENCOUNTER — Other Ambulatory Visit: Payer: Self-pay

## 2020-09-10 ENCOUNTER — Ambulatory Visit
Admission: RE | Admit: 2020-09-10 | Discharge: 2020-09-10 | Disposition: A | Discharge status: Home / Self Care | Payer: PRIVATE HEALTH INSURANCE | Source: Ambulatory Visit | Attending: Urology | Admitting: Urology

## 2020-09-10 DIAGNOSIS — R3129 Other microscopic hematuria: Secondary | ICD-10-CM | POA: Insufficient documentation

## 2020-09-10 MED ORDER — IOHEXOL 300 MG/ML  SOLN
125.0000 mL | Freq: Once | INTRAMUSCULAR | Status: AC | PRN
  Administered 2020-09-10: 125 mL via INTRAVENOUS

## 2020-09-20 ENCOUNTER — Ambulatory Visit (INDEPENDENT_AMBULATORY_CARE_PROVIDER_SITE_OTHER): Payer: PRIVATE HEALTH INSURANCE | Admitting: Urology

## 2020-09-20 ENCOUNTER — Encounter: Payer: Self-pay | Admitting: Urology

## 2020-09-20 ENCOUNTER — Other Ambulatory Visit: Payer: Self-pay

## 2020-09-20 VITALS — BP 136/82 | HR 79 | Ht 63.0 in | Wt 153.0 lb

## 2020-09-20 DIAGNOSIS — R3129 Other microscopic hematuria: Secondary | ICD-10-CM | POA: Diagnosis not present

## 2020-09-20 NOTE — Progress Notes (Signed)
   09/20/20  CC:  Chief Complaint  Patient presents with  . Cysto    HPI: Microhematuria, history Bosniak 51F cyst  Blood pressure 136/82, pulse 79, height 5\' 3"  (1.6 m), weight 153 lb (69.4 kg). NED. A&Ox3.   No respiratory distress   Abd soft, NT, ND Atrophic external genitalia with patent urethral meatus  Cystoscopy Procedure Note  Patient identification was confirmed, informed consent was obtained, and patient was prepped using Betadine solution.  Lidocaine jelly was administered per urethral meatus.    Procedure: - Flexible cystoscope introduced, without any difficulty.   - Thorough search of the bladder revealed:    normal urethral meatus    normal urothelium    no stones    no ulcers     no tumors    no urethral polyps    no trabeculation  - Ureteral orifices were normal in position and appearance.  Post-Procedure: - Patient tolerated the procedure well  CTU 09/10/2020: 3 cm right adrenal mass consistent with adrenal adenoma; prior exophytic lower pole cyst increased in size however no enhancement is seen.  Faint internal septation  Assessment/ Plan:  Stable Bosniak 51F right renal x10 years  No other upper tract abnormalities on CT  Unremarkable cystoscopy  Follow-up 1 year with [facility]   Abbie Sons, MD

## 2020-09-21 LAB — MICROSCOPIC EXAMINATION

## 2020-09-21 LAB — URINALYSIS, COMPLETE
Bilirubin, UA: NEGATIVE
Glucose, UA: NEGATIVE
Ketones, UA: NEGATIVE
Leukocytes,UA: NEGATIVE
Nitrite, UA: NEGATIVE
Protein,UA: NEGATIVE
Specific Gravity, UA: 1.01 (ref 1.005–1.030)
Urobilinogen, Ur: 0.2 mg/dL (ref 0.2–1.0)
pH, UA: 6.5 (ref 5.0–7.5)

## 2021-07-20 ENCOUNTER — Other Ambulatory Visit: Payer: Self-pay | Admitting: Obstetrics and Gynecology

## 2021-07-20 DIAGNOSIS — Z1231 Encounter for screening mammogram for malignant neoplasm of breast: Secondary | ICD-10-CM

## 2021-08-18 IMAGING — MG DIGITAL SCREENING BILAT W/ TOMO W/ CAD
8 series · 8 of 24 positions shown · non-contrast
Comparison: Previous exam(s).

CLINICAL DATA: Screening.

EXAM:
DIGITAL SCREENING BILATERAL MAMMOGRAM WITH TOMO AND CAD

[R MLO synth-2D]
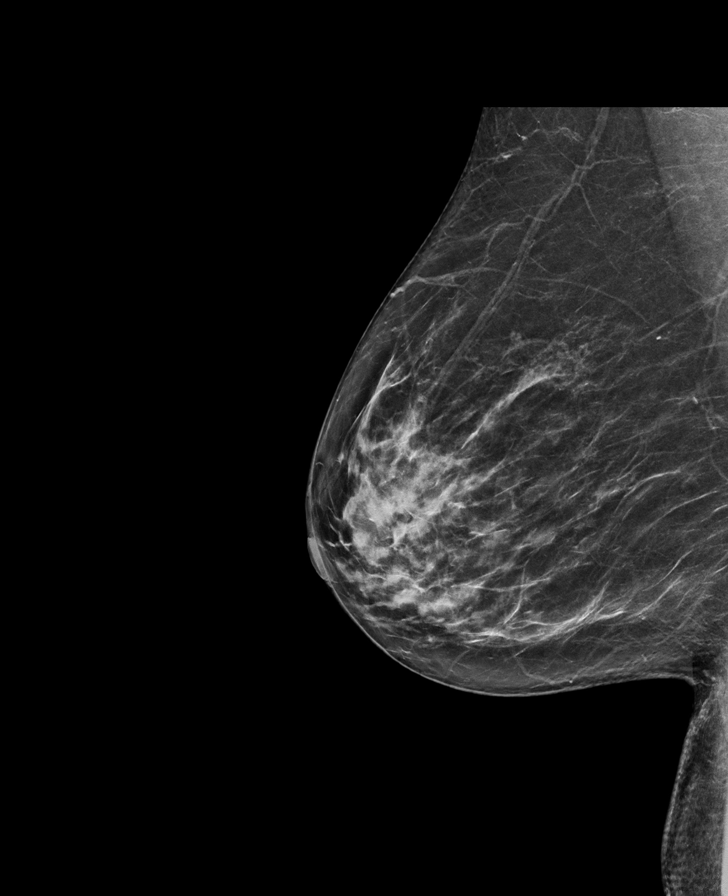

[L CC synth-2D]
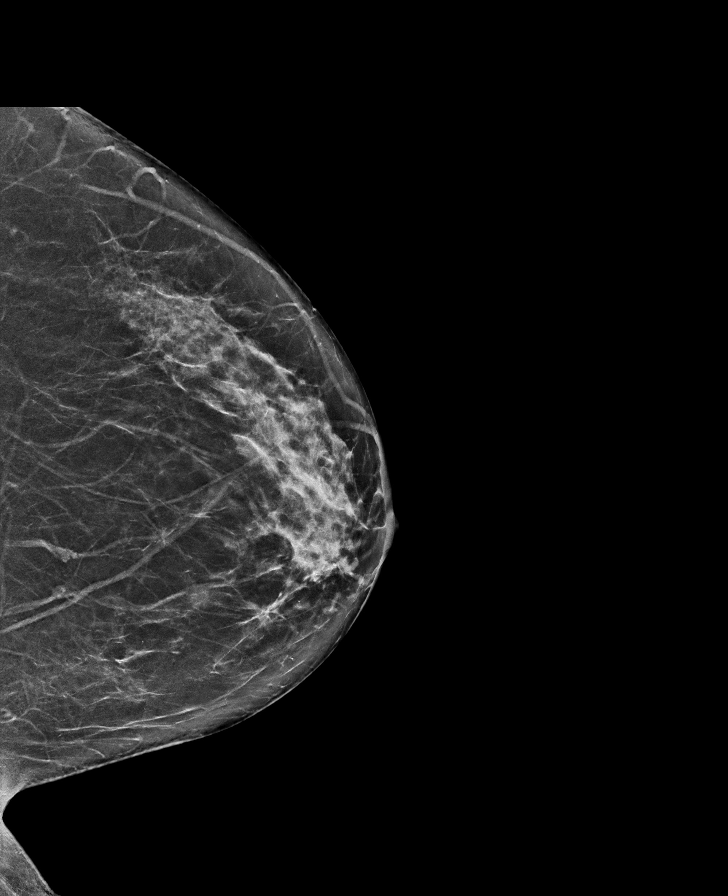

[L MLO synth-2D]
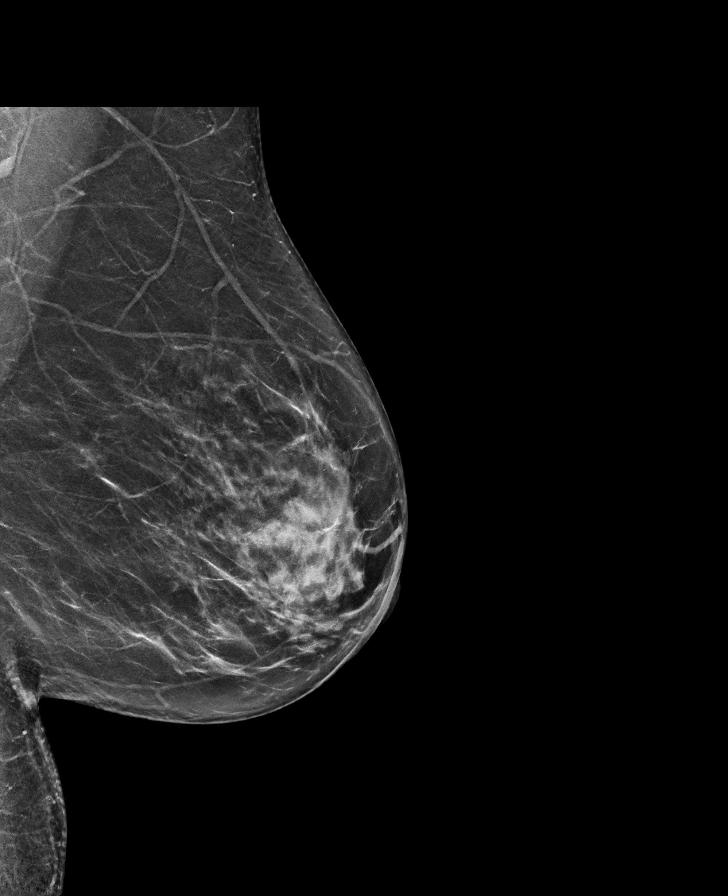

[R CC synth-2D]
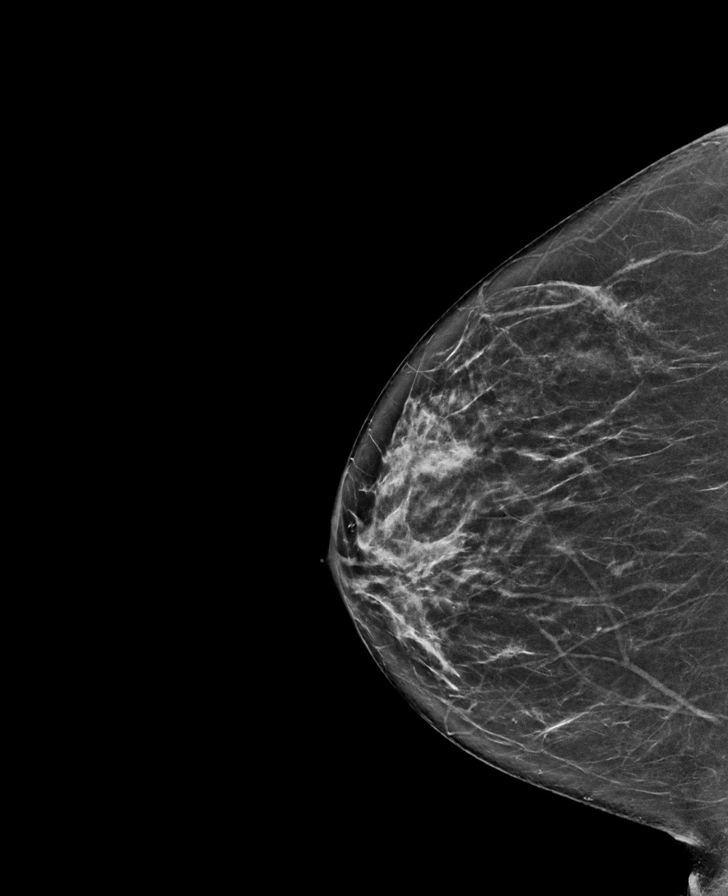

[R MLO tomo · tomo slice 35/69.0]
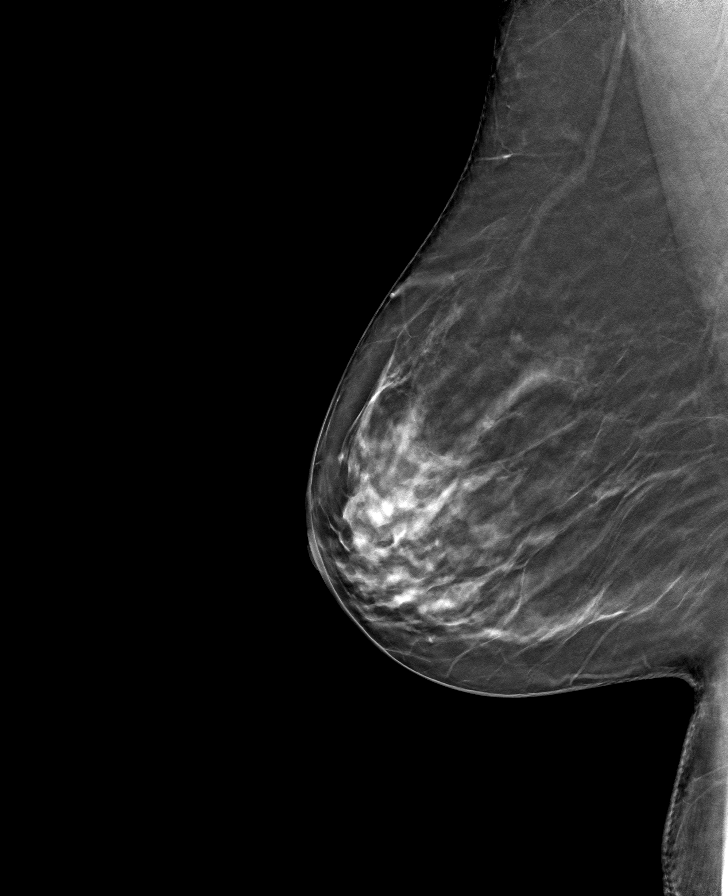

[R CC tomo · tomo slice 35/68.0]
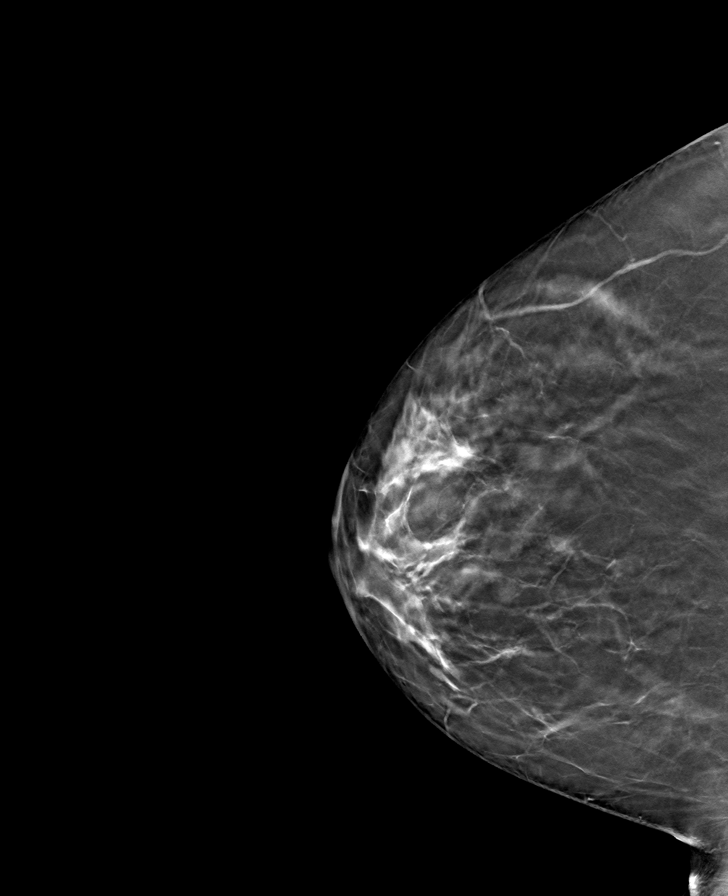

[L MLO tomo · tomo slice 36/71.0]
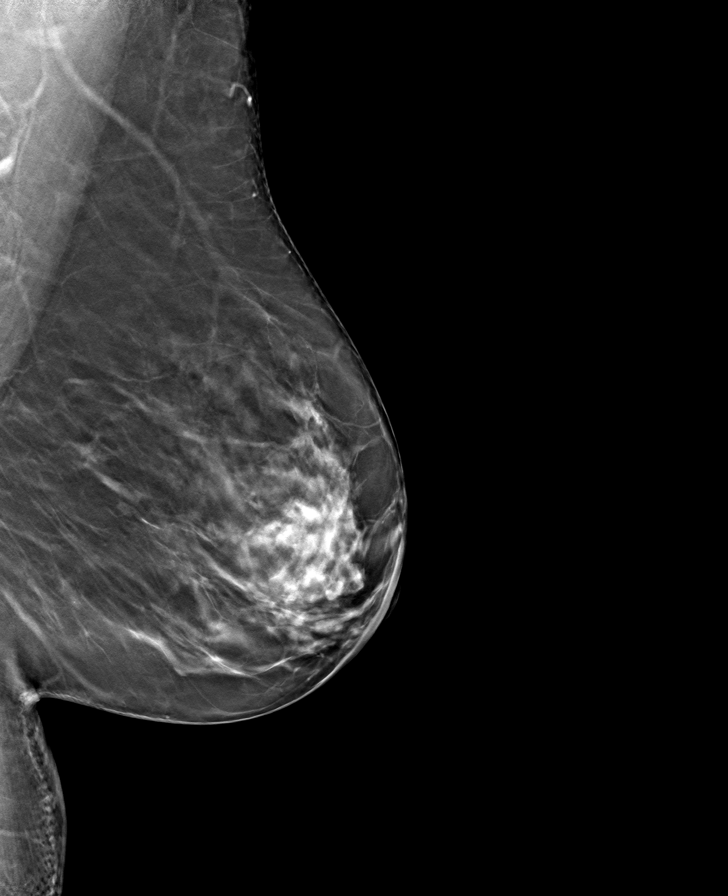

[L CC tomo · tomo slice 35/68.0]
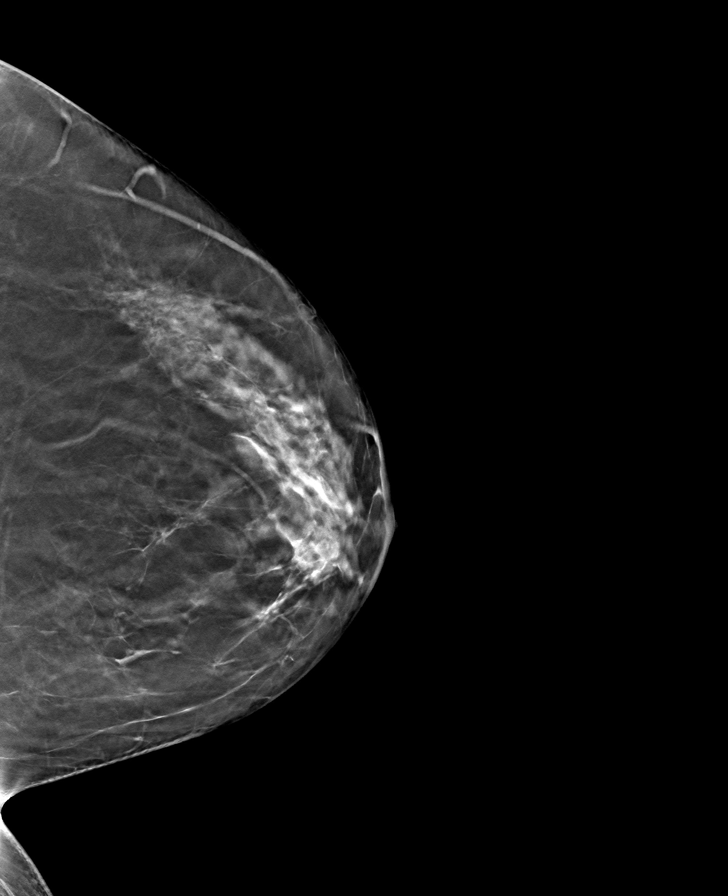

[8 of 24 positions shown; findings below may reference images not displayed]

ACR Breast Density Category c: The breast tissue is heterogeneously
dense, which may obscure small masses.
FINDINGS: There are no findings suspicious for malignancy. Images were
processed with CAD.
IMPRESSION: No mammographic evidence of malignancy. A result letter of this
screening mammogram will be mailed directly to the patient.

RECOMMENDATION:
Screening mammogram in one year. (Code:[id number])

BI-RADS CATEGORY  1: Negative.

## 2021-08-22 ENCOUNTER — Ambulatory Visit
Admission: RE | Admit: 2021-08-22 | Discharge: 2021-08-22 | Disposition: A | Discharge status: Home / Self Care | Payer: 59 | Source: Ambulatory Visit | Attending: Obstetrics and Gynecology | Admitting: Obstetrics and Gynecology

## 2021-08-22 ENCOUNTER — Other Ambulatory Visit: Payer: Self-pay

## 2021-08-22 DIAGNOSIS — Z1231 Encounter for screening mammogram for malignant neoplasm of breast: Secondary | ICD-10-CM | POA: Diagnosis not present

## 2021-09-21 ENCOUNTER — Ambulatory Visit (INDEPENDENT_AMBULATORY_CARE_PROVIDER_SITE_OTHER): Payer: 59 | Admitting: Urology

## 2021-09-21 ENCOUNTER — Other Ambulatory Visit: Payer: Self-pay

## 2021-09-21 ENCOUNTER — Encounter: Payer: Self-pay | Admitting: Urology

## 2021-09-21 VITALS — BP 154/72 | HR 90 | Ht 63.0 in | Wt 150.0 lb

## 2021-09-21 DIAGNOSIS — N281 Cyst of kidney, acquired: Secondary | ICD-10-CM | POA: Diagnosis not present

## 2021-09-21 DIAGNOSIS — R3129 Other microscopic hematuria: Secondary | ICD-10-CM | POA: Diagnosis not present

## 2021-09-21 LAB — URINALYSIS, COMPLETE
Bilirubin, UA: NEGATIVE
Glucose, UA: NEGATIVE
Ketones, UA: NEGATIVE
Nitrite, UA: NEGATIVE
Protein,UA: NEGATIVE
Specific Gravity, UA: 1.015 (ref 1.005–1.030)
Urobilinogen, Ur: 0.2 mg/dL (ref 0.2–1.0)
pH, UA: 6.5 (ref 5.0–7.5)

## 2021-09-21 LAB — MICROSCOPIC EXAMINATION: Epithelial Cells (non renal): >10 /hpf — AB (ref 0–10)

## 2021-09-21 NOTE — Progress Notes (Signed)
09/21/2021 1:32 PM   Phyllis Wright 02-Sep-1956 [id number]  Referring provider: Adin Hector, MD [facility] [place],  [place] [address]  Chief Complaint  Patient presents with   Hematuria   Urologic history: 1.  Asymptomatic microhematuria             -Upper tract imaging; see below             -Cystoscopy 8/27 no abnormalities             -Urine cytology negative  -Repeat CTU/cystoscopy 09/2020 (no bladder mucosal lesions and stable renal cyst)   2.  Bosniak 55F right renal cyst             -CT 05/2018; 3.2 x 4.5 cm septated right lower pole cyst with an enhancing septum -CTU with stable cyst since MR 2011 and due to long-term stability no further follow-up was recommended     HPI: 65 y.o. female presents for annual follow-up.  Doing well since last visit No bothersome LUTS Denies dysuria, gross hematuria Denies flank, abdominal or pelvic pain    PMH: Past Medical History:  Diagnosis Date   Allergy    Depression    GERD (gastroesophageal reflux disease)    Headache    Tension - couple x/month   Hypertension    Nephrolithiasis    Smell or taste problem    Stomach ulcer     Surgical History: Past Surgical History:  Procedure Laterality Date   CATARACT EXTRACTION W/PHACO Right 09/30/2019   Procedure: CATARACT EXTRACTION PHACO AND INTRAOCULAR LENS PLACEMENT (IOC) RIGHT 6.46  00:38.3;  Surgeon: Birder Robson, MD;  Location: [place];  Service: Ophthalmology;  Laterality: Right;   CHOLECYSTECTOMY  2004   colonoscopy  2014   ENDOMETRIAL ABLATION W/ NOVASURE  2006   EXCISION MORTON'S NEUROMA Right 11/05/2019   Procedure: EXCISION MORTON'S NEUROMA;  Surgeon: Samara Deist, DPM;  Location: [facility];  Service: Podiatry;  Laterality: Right;  local with sedation   KNEE SURGERY Right    TENNIS ELBOW RELEASE/NIRSCHEL PROCEDURE      Home Medications:  Allergies as of 09/21/2021       Reactions   Ceftin [cefuroxime  Axetil] Itching, Rash        Medication List        Accurate as of September 21, 2021  1:32 PM. If you have any questions, ask your nurse or doctor.          acetaminophen 325 MG tablet Commonly known as: TYLENOL Take 650 mg by mouth every 6 (six) hours as needed for moderate pain.   b complex vitamins capsule Take 1 capsule by mouth every other day.   Biotin 5000 MCG Tabs Take 5,000 mcg by mouth daily.   CALCIUM CARBONATE PO Take 1 tablet by mouth daily.   cholecalciferol 1000 units tablet Commonly known as: VITAMIN D Take 1,000 Units by mouth daily.   citalopram 20 MG tablet Commonly known as: CELEXA Take 10 mg by mouth daily.   clonazePAM 0.5 MG tablet Commonly known as: KLONOPIN Take 0.5 mg by mouth daily as needed for anxiety.   cyclobenzaprine 10 MG tablet Commonly known as: FLEXERIL Take 10 mg by mouth 3 (three) times daily as needed for muscle spasms.   HYDROcodone-acetaminophen 5-325 MG tablet Commonly known as: Norco Take 1 tablet by mouth every 6 (six) hours as needed for moderate pain.   IRON PO Take 45 mg by mouth daily.  lisinopril 10 MG tablet Commonly known as: ZESTRIL Take 5 mg by mouth at bedtime.   Melatonin 10 MG Tabs Take 10 mg by mouth at bedtime.   multivitamin-lutein Caps capsule Take 1 capsule by mouth every other day.   omeprazole 20 MG capsule Commonly known as: PRILOSEC Take 20 mg by mouth daily.   VITAMIN C PO Take 500 mg by mouth daily.        Allergies:  Allergies  Allergen Reactions   Ceftin [Cefuroxime Axetil] Itching and Rash    Family History: Family History  Problem Relation Age of Onset   Breast cancer Mother 36   Prostate cancer Father    Ovarian cancer Sister     Social History:  reports that she has never smoked. She has never used smokeless tobacco. She reports current alcohol use of about 1.0 standard drink per week. She reports that she does not use drugs.   Physical Exam: BP (!)  154/72   Pulse 90   Ht 5\' 3"  (1.6 m)   Wt 150 lb (68 kg)   BMI 26.57 kg/m   Constitutional:  Alert and oriented, No acute distress. HEENT: [place] AT, moist mucus membranes.  Trachea midline, no masses. Cardiovascular: No clubbing, cyanosis, or edema. Respiratory: Normal respiratory effort, no increased work of breathing. Psychiatric: Normal mood and affect.  Laboratory Data:  Urinalysis 09/21/2021: Dipstick 1+ blood/microscopy no significant RBCs No results found for this or any previous visit.   Assessment & Plan:    1. Microhematuria UA today clear, continue annual follow-up  2.  Bosniak 13F renal cyst Long-term stability for 10 years; no further routine imaging recommended   Abbie Sons, MD  [place] [address] [place], [place] [address] [phone]

## 2022-01-05 DIAGNOSIS — J0140 Acute pansinusitis, unspecified: Secondary | ICD-10-CM | POA: Diagnosis not present

## 2022-01-05 DIAGNOSIS — I10 Essential (primary) hypertension: Secondary | ICD-10-CM | POA: Diagnosis not present

## 2022-02-09 DIAGNOSIS — R0981 Nasal congestion: Secondary | ICD-10-CM | POA: Diagnosis not present

## 2022-02-09 DIAGNOSIS — J0140 Acute pansinusitis, unspecified: Secondary | ICD-10-CM | POA: Diagnosis not present

## 2022-02-20 DIAGNOSIS — L570 Actinic keratosis: Secondary | ICD-10-CM | POA: Diagnosis not present

## 2022-02-20 DIAGNOSIS — X32XXXA Exposure to sunlight, initial encounter: Secondary | ICD-10-CM | POA: Diagnosis not present

## 2022-02-20 DIAGNOSIS — L720 Epidermal cyst: Secondary | ICD-10-CM | POA: Diagnosis not present

## 2022-03-07 DIAGNOSIS — M654 Radial styloid tenosynovitis [de Quervain]: Secondary | ICD-10-CM | POA: Diagnosis not present

## 2022-03-07 DIAGNOSIS — M65322 Trigger finger, left index finger: Secondary | ICD-10-CM | POA: Diagnosis not present

## 2022-03-29 DIAGNOSIS — M65322 Trigger finger, left index finger: Secondary | ICD-10-CM | POA: Diagnosis not present

## 2022-03-31 DIAGNOSIS — J209 Acute bronchitis, unspecified: Secondary | ICD-10-CM | POA: Diagnosis not present

## 2022-03-31 DIAGNOSIS — R739 Hyperglycemia, unspecified: Secondary | ICD-10-CM | POA: Diagnosis not present

## 2022-03-31 DIAGNOSIS — R051 Acute cough: Secondary | ICD-10-CM | POA: Diagnosis not present

## 2022-03-31 DIAGNOSIS — E559 Vitamin D deficiency, unspecified: Secondary | ICD-10-CM | POA: Diagnosis not present

## 2022-03-31 DIAGNOSIS — E785 Hyperlipidemia, unspecified: Secondary | ICD-10-CM | POA: Diagnosis not present

## 2022-04-11 DIAGNOSIS — E559 Vitamin D deficiency, unspecified: Secondary | ICD-10-CM | POA: Diagnosis not present

## 2022-04-11 DIAGNOSIS — R69 Illness, unspecified: Secondary | ICD-10-CM | POA: Diagnosis not present

## 2022-04-11 DIAGNOSIS — R3129 Other microscopic hematuria: Secondary | ICD-10-CM | POA: Diagnosis not present

## 2022-04-11 DIAGNOSIS — Z0000 Encounter for general adult medical examination without abnormal findings: Secondary | ICD-10-CM | POA: Diagnosis not present

## 2022-04-11 DIAGNOSIS — R7303 Prediabetes: Secondary | ICD-10-CM | POA: Diagnosis not present

## 2022-04-11 DIAGNOSIS — K219 Gastro-esophageal reflux disease without esophagitis: Secondary | ICD-10-CM | POA: Diagnosis not present

## 2022-04-11 DIAGNOSIS — I10 Essential (primary) hypertension: Secondary | ICD-10-CM | POA: Diagnosis not present

## 2022-04-11 DIAGNOSIS — E282 Polycystic ovarian syndrome: Secondary | ICD-10-CM | POA: Diagnosis not present

## 2022-04-11 DIAGNOSIS — E785 Hyperlipidemia, unspecified: Secondary | ICD-10-CM | POA: Diagnosis not present

## 2022-04-16 DIAGNOSIS — R109 Unspecified abdominal pain: Secondary | ICD-10-CM | POA: Diagnosis not present

## 2022-04-16 DIAGNOSIS — R1012 Left upper quadrant pain: Secondary | ICD-10-CM | POA: Diagnosis not present

## 2022-07-27 DIAGNOSIS — Z23 Encounter for immunization: Secondary | ICD-10-CM | POA: Diagnosis not present

## 2022-08-07 DIAGNOSIS — H26491 Other secondary cataract, right eye: Secondary | ICD-10-CM | POA: Diagnosis not present

## 2022-08-28 DIAGNOSIS — K219 Gastro-esophageal reflux disease without esophagitis: Secondary | ICD-10-CM | POA: Diagnosis not present

## 2022-08-28 DIAGNOSIS — R059 Cough, unspecified: Secondary | ICD-10-CM | POA: Diagnosis not present

## 2022-08-28 DIAGNOSIS — R053 Chronic cough: Secondary | ICD-10-CM | POA: Diagnosis not present

## 2022-08-28 DIAGNOSIS — I10 Essential (primary) hypertension: Secondary | ICD-10-CM | POA: Diagnosis not present

## 2022-08-28 DIAGNOSIS — R7303 Prediabetes: Secondary | ICD-10-CM | POA: Diagnosis not present

## 2022-08-28 DIAGNOSIS — J302 Other seasonal allergic rhinitis: Secondary | ICD-10-CM | POA: Diagnosis not present

## 2022-08-28 DIAGNOSIS — R0781 Pleurodynia: Secondary | ICD-10-CM | POA: Diagnosis not present

## 2022-09-13 DIAGNOSIS — Z1211 Encounter for screening for malignant neoplasm of colon: Secondary | ICD-10-CM | POA: Diagnosis not present

## 2022-09-13 DIAGNOSIS — Z1231 Encounter for screening mammogram for malignant neoplasm of breast: Secondary | ICD-10-CM | POA: Diagnosis not present

## 2022-09-13 DIAGNOSIS — Z01419 Encounter for gynecological examination (general) (routine) without abnormal findings: Secondary | ICD-10-CM | POA: Diagnosis not present

## 2022-09-21 ENCOUNTER — Ambulatory Visit: Payer: 59 | Admitting: Urology

## 2022-09-22 ENCOUNTER — Ambulatory Visit: Payer: 59 | Admitting: Urology

## 2022-10-02 ENCOUNTER — Other Ambulatory Visit: Payer: Self-pay | Admitting: Obstetrics and Gynecology

## 2022-10-02 ENCOUNTER — Ambulatory Visit: Payer: 59 | Admitting: Urology

## 2022-10-02 DIAGNOSIS — Z1231 Encounter for screening mammogram for malignant neoplasm of breast: Secondary | ICD-10-CM

## 2022-10-04 ENCOUNTER — Ambulatory Visit: Payer: 59 | Admitting: Urology

## 2022-10-04 ENCOUNTER — Encounter: Payer: Self-pay | Admitting: Urology

## 2022-10-04 VITALS — BP 133/75 | HR 74 | Ht 63.0 in | Wt 142.0 lb

## 2022-10-04 DIAGNOSIS — E785 Hyperlipidemia, unspecified: Secondary | ICD-10-CM | POA: Diagnosis not present

## 2022-10-04 DIAGNOSIS — R3129 Other microscopic hematuria: Secondary | ICD-10-CM | POA: Diagnosis not present

## 2022-10-04 DIAGNOSIS — N281 Cyst of kidney, acquired: Secondary | ICD-10-CM

## 2022-10-04 DIAGNOSIS — R7303 Prediabetes: Secondary | ICD-10-CM | POA: Diagnosis not present

## 2022-10-04 NOTE — Progress Notes (Unsigned)
10/04/2022 8:36 PM   Phyllis Wright 11-25-1955 [id number]  Referring provider: Adin Hector, MD [facility] [place],  [place] [address]  Chief Complaint  Patient presents with   Other   Urologic history: 1.  Asymptomatic microhematuria             -Upper tract imaging; see below             -Cystoscopy 8/27 no abnormalities             -Urine cytology negative  -Repeat CTU/cystoscopy 09/2020 (no bladder mucosal lesions and stable renal cyst)   2.  Bosniak 73F right renal cyst             -CT 05/2018; 3.2 x 4.5 cm septated right lower pole cyst with an enhancing septum -CTU with stable cyst since MR 2011 and due to long-term stability no further follow-up was recommended     HPI: 66 y.o. female presents for annual follow-up.  Doing well since last visit No bothersome LUTS Denies dysuria, gross hematuria Denies flank, abdominal or pelvic pain   PMH: Past Medical History:  Diagnosis Date   Allergy    Depression    GERD (gastroesophageal reflux disease)    Headache    Tension - couple x/month   Hypertension    Nephrolithiasis    Smell or taste problem    Stomach ulcer     Surgical History: Past Surgical History:  Procedure Laterality Date   CATARACT EXTRACTION W/PHACO Right 09/30/2019   Procedure: CATARACT EXTRACTION PHACO AND INTRAOCULAR LENS PLACEMENT (IOC) RIGHT 6.46  00:38.3;  Surgeon: Birder Robson, MD;  Location: [place];  Service: Ophthalmology;  Laterality: Right;   CHOLECYSTECTOMY  2004   colonoscopy  2014   ENDOMETRIAL ABLATION W/ NOVASURE  2006   EXCISION MORTON'S NEUROMA Right 11/05/2019   Procedure: EXCISION MORTON'S NEUROMA;  Surgeon: Samara Deist, DPM;  Location: [facility];  Service: Podiatry;  Laterality: Right;  local with sedation   KNEE SURGERY Right    TENNIS ELBOW RELEASE/NIRSCHEL PROCEDURE      Home Medications:  Allergies as of 10/04/2022       Reactions   Ceftin [cefuroxime  Axetil] Itching, Rash        Medication List        Accurate as of October 04, 2022 11:59 PM. If you have any questions, ask your nurse or doctor.          acetaminophen 325 MG tablet Commonly known as: TYLENOL Take 650 mg by mouth every 6 (six) hours as needed for moderate pain.   b complex vitamins capsule Take 1 capsule by mouth every other day.   Biotin 5000 MCG Tabs Take 5,000 mcg by mouth daily.   CALCIUM CARBONATE PO Take 1 tablet by mouth daily.   cholecalciferol 1000 units tablet Commonly known as: VITAMIN D Take 1,000 Units by mouth daily.   citalopram 20 MG tablet Commonly known as: CELEXA Take 10 mg by mouth daily.   clonazePAM 0.5 MG tablet Commonly known as: KLONOPIN Take 0.5 mg by mouth daily as needed for anxiety.   cyclobenzaprine 10 MG tablet Commonly known as: FLEXERIL Take 10 mg by mouth 3 (three) times daily as needed for muscle spasms.   HYDROcodone-acetaminophen 5-325 MG tablet Commonly known as: Norco Take 1 tablet by mouth every 6 (six) hours as needed for moderate pain.   IRON PO Take 45 mg by mouth daily.  lisinopril 10 MG tablet Commonly known as: ZESTRIL Take 5 mg by mouth at bedtime.   Melatonin 10 MG Tabs Take 10 mg by mouth at bedtime.   multivitamin-lutein Caps capsule Take 1 capsule by mouth every other day.   omeprazole 20 MG capsule Commonly known as: PRILOSEC Take 20 mg by mouth daily.   VITAMIN C PO Take 500 mg by mouth daily.        Allergies:  Allergies  Allergen Reactions   Ceftin [Cefuroxime Axetil] Itching and Rash    Family History: Family History  Problem Relation Age of Onset   Breast cancer Mother 14   Prostate cancer Father    Ovarian cancer Sister     Social History:  reports that she has never smoked. She has never used smokeless tobacco. She reports current alcohol use of about 1.0 standard drink of alcohol per week. She reports that she does not use drugs.   Physical  Exam: BP 133/75   Pulse 74   Ht '5\' 3"'$  (1.6 m)   Wt 142 lb (64.4 kg)   BMI 25.15 kg/m   Constitutional:  Alert and oriented, No acute distress. HEENT: [place] AT, moist mucus membranes.  Trachea midline, no masses. Cardiovascular: No clubbing, cyanosis, or edema. Respiratory: Normal respiratory effort, no increased work of breathing. Psychiatric: Normal mood and affect.  Laboratory Data:  Urinalysis Microscopy negative   Assessment & Plan:    1. Microhematuria UA today clear She has an annual UA with her PCP and would like to follow-up here prn  2.  Bosniak 22F renal cyst Long-term stability for 10 years; no further routine imaging recommended   Abbie Sons, MD  [facility] [address], [place], [place] [address] [phone]

## 2022-10-05 ENCOUNTER — Encounter: Payer: Self-pay | Admitting: Urology

## 2022-10-05 LAB — URINALYSIS, COMPLETE
Bilirubin, UA: NEGATIVE
Glucose, UA: NEGATIVE
Nitrite, UA: NEGATIVE
Protein,UA: NEGATIVE
Specific Gravity, UA: 1.025 (ref 1.005–1.030)
Urobilinogen, Ur: 0.2 mg/dL (ref 0.2–1.0)
pH, UA: 5 (ref 5.0–7.5)

## 2022-10-05 LAB — MICROSCOPIC EXAMINATION: Bacteria, UA: NONE SEEN

## 2022-10-11 DIAGNOSIS — E785 Hyperlipidemia, unspecified: Secondary | ICD-10-CM | POA: Diagnosis not present

## 2022-10-11 DIAGNOSIS — I10 Essential (primary) hypertension: Secondary | ICD-10-CM | POA: Diagnosis not present

## 2022-10-11 DIAGNOSIS — R7303 Prediabetes: Secondary | ICD-10-CM | POA: Diagnosis not present

## 2022-10-11 DIAGNOSIS — E559 Vitamin D deficiency, unspecified: Secondary | ICD-10-CM | POA: Diagnosis not present

## 2022-10-11 DIAGNOSIS — E282 Polycystic ovarian syndrome: Secondary | ICD-10-CM | POA: Diagnosis not present

## 2022-10-11 DIAGNOSIS — R3129 Other microscopic hematuria: Secondary | ICD-10-CM | POA: Diagnosis not present

## 2022-10-11 DIAGNOSIS — K219 Gastro-esophageal reflux disease without esophagitis: Secondary | ICD-10-CM | POA: Diagnosis not present

## 2022-10-11 DIAGNOSIS — F419 Anxiety disorder, unspecified: Secondary | ICD-10-CM | POA: Diagnosis not present

## 2022-10-25 DIAGNOSIS — D225 Melanocytic nevi of trunk: Secondary | ICD-10-CM | POA: Diagnosis not present

## 2022-10-25 DIAGNOSIS — L821 Other seborrheic keratosis: Secondary | ICD-10-CM | POA: Diagnosis not present

## 2022-10-25 DIAGNOSIS — Z872 Personal history of diseases of the skin and subcutaneous tissue: Secondary | ICD-10-CM | POA: Diagnosis not present

## 2022-10-25 DIAGNOSIS — D2262 Melanocytic nevi of left upper limb, including shoulder: Secondary | ICD-10-CM | POA: Diagnosis not present

## 2022-10-25 DIAGNOSIS — Z85828 Personal history of other malignant neoplasm of skin: Secondary | ICD-10-CM | POA: Diagnosis not present

## 2022-10-25 DIAGNOSIS — D2261 Melanocytic nevi of right upper limb, including shoulder: Secondary | ICD-10-CM | POA: Diagnosis not present

## 2022-10-27 DIAGNOSIS — Z1211 Encounter for screening for malignant neoplasm of colon: Secondary | ICD-10-CM | POA: Diagnosis not present

## 2022-11-13 DIAGNOSIS — S61412A Laceration without foreign body of left hand, initial encounter: Secondary | ICD-10-CM | POA: Diagnosis not present

## 2022-11-20 DIAGNOSIS — Z4802 Encounter for removal of sutures: Secondary | ICD-10-CM | POA: Diagnosis not present

## 2022-11-21 DIAGNOSIS — R3915 Urgency of urination: Secondary | ICD-10-CM | POA: Diagnosis not present

## 2022-11-21 DIAGNOSIS — R350 Frequency of micturition: Secondary | ICD-10-CM | POA: Diagnosis not present

## 2022-11-23 ENCOUNTER — Ambulatory Visit
Admission: RE | Admit: 2022-11-23 | Discharge: 2022-11-23 | Disposition: A | Discharge status: Home / Self Care | Payer: 59 | Source: Ambulatory Visit | Attending: Obstetrics and Gynecology | Admitting: Obstetrics and Gynecology

## 2022-11-23 DIAGNOSIS — Z1231 Encounter for screening mammogram for malignant neoplasm of breast: Secondary | ICD-10-CM | POA: Insufficient documentation

## 2022-12-29 DIAGNOSIS — Z23 Encounter for immunization: Secondary | ICD-10-CM | POA: Diagnosis not present

## 2023-04-05 ENCOUNTER — Other Ambulatory Visit: Payer: Self-pay | Admitting: Internal Medicine

## 2023-04-05 DIAGNOSIS — R1084 Generalized abdominal pain: Secondary | ICD-10-CM

## 2023-04-12 ENCOUNTER — Ambulatory Visit
Admission: RE | Admit: 2023-04-12 | Discharge: 2023-04-12 | Disposition: A | Discharge status: Home / Self Care | Payer: 59 | Source: Ambulatory Visit | Attending: Internal Medicine | Admitting: Internal Medicine

## 2023-04-12 DIAGNOSIS — R1084 Generalized abdominal pain: Secondary | ICD-10-CM

## 2023-04-12 MED ORDER — IOPAMIDOL (ISOVUE-300) INJECTION 61%
100.0000 mL | Freq: Once | INTRAVENOUS | Status: AC | PRN
  Administered 2023-04-12: 100 mL via INTRAVENOUS

## 2023-07-16 ENCOUNTER — Other Ambulatory Visit: Payer: Self-pay | Admitting: Neurology

## 2023-07-16 DIAGNOSIS — R519 Headache, unspecified: Secondary | ICD-10-CM

## 2023-07-30 ENCOUNTER — Encounter: Payer: Self-pay | Admitting: Neurology

## 2023-08-02 ENCOUNTER — Ambulatory Visit
Admission: RE | Admit: 2023-08-02 | Discharge: 2023-08-02 | Disposition: A | Discharge status: Home / Self Care | Payer: 59 | Source: Ambulatory Visit | Attending: Neurology | Admitting: Neurology

## 2023-08-02 DIAGNOSIS — R519 Headache, unspecified: Secondary | ICD-10-CM

## 2023-08-02 MED ORDER — GADOPICLENOL 0.5 MMOL/ML IV SOLN
7.5000 mL | Freq: Once | INTRAVENOUS | Status: AC | PRN
  Administered 2023-08-02: 6.5 mL via INTRAVENOUS

## 2023-09-17 ENCOUNTER — Other Ambulatory Visit: Payer: Self-pay | Admitting: Obstetrics and Gynecology

## 2023-09-17 DIAGNOSIS — Z1231 Encounter for screening mammogram for malignant neoplasm of breast: Secondary | ICD-10-CM

## 2023-10-24 ENCOUNTER — Encounter: Payer: Self-pay | Admitting: Ophthalmology

## 2023-10-29 ENCOUNTER — Encounter: Payer: Self-pay | Admitting: Ophthalmology

## 2023-10-29 NOTE — Anesthesia Preprocedure Evaluation (Signed)
Anesthesia Evaluation    Airway        Dental   Pulmonary           Cardiovascular hypertension,      Neuro/Psych    GI/Hepatic   Endo/Other    Renal/GU      Musculoskeletal   Abdominal   Peds  Hematology   Anesthesia Other Findings Hypertension  GERD (gastroesophageal reflux disease) Allergy  Smell or taste problem Depression  Stomach ulcer Headache     Reproductive/Obstetrics                              Anesthesia Physical Anesthesia Plan Anesthesia Quick Evaluation

## 2023-11-09 NOTE — Discharge Instructions (Signed)

## 2023-11-13 ENCOUNTER — Ambulatory Visit: Payer: Medicare Other | Admitting: Anesthesiology

## 2023-11-13 ENCOUNTER — Other Ambulatory Visit: Payer: Self-pay

## 2023-11-13 ENCOUNTER — Encounter
Admission: RE | Disposition: A | Discharge status: Home / Self Care | Payer: Self-pay | Source: Home / Self Care | Attending: Ophthalmology

## 2023-11-13 ENCOUNTER — Encounter: Payer: Self-pay | Admitting: Ophthalmology

## 2023-11-13 ENCOUNTER — Ambulatory Visit
Admission: RE | Admit: 2023-11-13 | Discharge: 2023-11-13 | Disposition: A | Discharge status: Home / Self Care | Payer: Medicare Other | Attending: Ophthalmology | Admitting: Ophthalmology

## 2023-11-13 DIAGNOSIS — G4733 Obstructive sleep apnea (adult) (pediatric): Secondary | ICD-10-CM | POA: Insufficient documentation

## 2023-11-13 DIAGNOSIS — K219 Gastro-esophageal reflux disease without esophagitis: Secondary | ICD-10-CM | POA: Insufficient documentation

## 2023-11-13 DIAGNOSIS — G43909 Migraine, unspecified, not intractable, without status migrainosus: Secondary | ICD-10-CM | POA: Diagnosis not present

## 2023-11-13 DIAGNOSIS — H2512 Age-related nuclear cataract, left eye: Secondary | ICD-10-CM | POA: Diagnosis present

## 2023-11-13 DIAGNOSIS — I10 Essential (primary) hypertension: Secondary | ICD-10-CM | POA: Insufficient documentation

## 2023-11-13 DIAGNOSIS — F419 Anxiety disorder, unspecified: Secondary | ICD-10-CM | POA: Insufficient documentation

## 2023-11-13 DIAGNOSIS — F32A Depression, unspecified: Secondary | ICD-10-CM | POA: Insufficient documentation

## 2023-11-13 HISTORY — DX: Obstructive sleep apnea (adult) (pediatric): G47.33

## 2023-11-13 HISTORY — DX: Personal history of other diseases of the nervous system and sense organs: Z86.69

## 2023-11-13 HISTORY — PX: CATARACT EXTRACTION W/PHACO: SHX586

## 2023-11-13 HISTORY — DX: Migraine with aura, not intractable, without status migrainosus: G43.109

## 2023-11-13 SURGERY — PHACOEMULSIFICATION, CATARACT, WITH IOL INSERTION
Anesthesia: Monitor Anesthesia Care | Laterality: Left

## 2023-11-13 MED ORDER — TETRACAINE HCL 0.5 % OP SOLN
OPHTHALMIC | Status: AC
  Filled 2023-11-13: qty 4

## 2023-11-13 MED ORDER — MOXIFLOXACIN HCL 0.5 % OP SOLN
OPHTHALMIC | Status: DC | PRN
  Administered 2023-11-13: .2 mL via OPHTHALMIC

## 2023-11-13 MED ORDER — SIGHTPATH DOSE#1 NA CHONDROIT SULF-NA HYALURON 40-17 MG/ML IO SOLN
INTRAOCULAR | Status: DC | PRN
  Administered 2023-11-13: 1 mL via INTRAOCULAR

## 2023-11-13 MED ORDER — SIGHTPATH DOSE#1 BSS IO SOLN
INTRAOCULAR | Status: DC | PRN
  Administered 2023-11-13: 15 mL via INTRAOCULAR

## 2023-11-13 MED ORDER — MIDAZOLAM HCL 2 MG/2ML IJ SOLN
INTRAMUSCULAR | Status: DC | PRN
  Administered 2023-11-13: 2 mg via INTRAVENOUS

## 2023-11-13 MED ORDER — SIGHTPATH DOSE#1 BSS IO SOLN
INTRAOCULAR | Status: DC | PRN
  Administered 2023-11-13: 2 mL

## 2023-11-13 MED ORDER — ARMC OPHTHALMIC DILATING DROPS
1.0000 | OPHTHALMIC | Status: DC | PRN
  Administered 2023-11-13 (×3): 1 via OPHTHALMIC

## 2023-11-13 MED ORDER — BRIMONIDINE TARTRATE-TIMOLOL 0.2-0.5 % OP SOLN
OPHTHALMIC | Status: DC | PRN
  Administered 2023-11-13: 1 [drp] via OPHTHALMIC

## 2023-11-13 MED ORDER — SIGHTPATH DOSE#1 BSS IO SOLN
INTRAOCULAR | Status: DC | PRN
  Administered 2023-11-13: 53 mL via OPHTHALMIC

## 2023-11-13 MED ORDER — FENTANYL CITRATE (PF) 100 MCG/2ML IJ SOLN
INTRAMUSCULAR | Status: DC | PRN
  Administered 2023-11-13: 50 ug via INTRAVENOUS

## 2023-11-13 MED ORDER — MIDAZOLAM HCL 2 MG/2ML IJ SOLN
INTRAMUSCULAR | Status: AC
Start: 2023-11-13 — End: ?
  Filled 2023-11-13: qty 2

## 2023-11-13 MED ORDER — FENTANYL CITRATE (PF) 100 MCG/2ML IJ SOLN
INTRAMUSCULAR | Status: AC
  Filled 2023-11-13: qty 2

## 2023-11-13 MED ORDER — ARMC OPHTHALMIC DILATING DROPS
OPHTHALMIC | Status: AC
  Filled 2023-11-13: qty 0.5

## 2023-11-13 MED ORDER — TETRACAINE HCL 0.5 % OP SOLN
1.0000 [drp] | OPHTHALMIC | Status: DC | PRN
  Administered 2023-11-13 (×3): 1 [drp] via OPHTHALMIC

## 2023-11-13 SURGICAL SUPPLY — 11 items
ANGLE REVERSE CUT SHRT 25GA (CUTTER) ×1
CATARACT SUITE SIGHTPATH (MISCELLANEOUS) ×1
CYSTOTOME ANGL RVRS SHRT 25G (CUTTER) ×1 IMPLANT
FEE CATARACT SUITE SIGHTPATH (MISCELLANEOUS) ×1 IMPLANT
GLOVE BIOGEL PI IND STRL 8 (GLOVE) ×1 IMPLANT
GLOVE SURG LX STRL 8.0 MICRO (GLOVE) ×1 IMPLANT
LENS IOL TECNIS EYHANCE 19.5 (Intraocular Lens) IMPLANT
NDL FILTER BLUNT 18X1 1/2 (NEEDLE) ×1 IMPLANT
NEEDLE FILTER BLUNT 18X1 1/2 (NEEDLE) ×1
RING MALYGIN (MISCELLANEOUS) IMPLANT
SYR 3ML LL SCALE MARK (SYRINGE) ×1 IMPLANT

## 2023-11-13 NOTE — H&P (Signed)
 [facility]   Primary Care Physician:  Fernande Ophelia JINNY DOUGLAS, MD Ophthalmologist: Dr. Ollie  Pre-Procedure History & Physical: HPI:  Phyllis Wright is a 68 y.o. female here for cataract surgery.   Past Medical History:  Diagnosis Date   Allergy    Depression    GERD (gastroesophageal reflux disease)    Headache    Tension - couple x/month   History of trigeminal neuralgia    Hypertension    Migraine with aura    OSA (obstructive sleep apnea)    Smell or taste problem    Stomach ulcer     Past Surgical History:  Procedure Laterality Date   CATARACT EXTRACTION W/PHACO Right 09/30/2019   Procedure: CATARACT EXTRACTION PHACO AND INTRAOCULAR LENS PLACEMENT (IOC) RIGHT 6.46  00:38.3;  Surgeon: Jaye Fallow, MD;  Location: [facility];  Service: Ophthalmology;  Laterality: Right;   CHOLECYSTECTOMY  2004   colonoscopy  2014   ENDOMETRIAL ABLATION W/ NOVASURE  2006   EXCISION MORTON'S NEUROMA Right 11/05/2019   Procedure: EXCISION MORTON'S NEUROMA;  Surgeon: Ashley Soulier, DPM;  Location: [facility];  Service: Podiatry;  Laterality: Right;  local with sedation   KNEE SURGERY Right    TENNIS ELBOW RELEASE/NIRSCHEL PROCEDURE      Prior to Admission medications   Medication Sig Start Date End Date Taking? Authorizing Provider  acetaminophen  (TYLENOL ) 325 MG tablet Take 650 mg by mouth every 6 (six) hours as needed for moderate pain.    Yes [provider]  Ascorbic Acid (VITAMIN C PO) Take 500 mg by mouth daily.    Yes [provider]  b complex vitamins capsule Take 1 capsule by mouth every other day.    Yes [provider]  Biotin 5000 MCG TABS Take 5,000 mcg by mouth daily.    Yes [provider]  Calcium Carbonate Antacid (CALCIUM CARBONATE PO) Take 1 tablet by mouth daily.    Yes [provider]  cholecalciferol (VITAMIN D) 1000 units tablet Take 1,000 Units by mouth daily.   Yes [provider]  citalopram  (CELEXA) 20 MG tablet Take 10 mg by mouth daily.  04/09/18  Yes [provider]  clonazePAM (KLONOPIN) 0.5 MG tablet Take 0.5 mg by mouth daily as needed for anxiety.  09/17/17  Yes [provider]  cyclobenzaprine (FLEXERIL) 10 MG tablet Take 10 mg by mouth 3 (three) times daily as needed for muscle spasms.   Yes [provider]  lisinopril (PRINIVIL,ZESTRIL) 10 MG tablet Take 5 mg by mouth at bedtime.    Yes [provider]  Melatonin 10 MG TABS Take 10 mg by mouth at bedtime.   Yes [provider]  multivitamin-lutein (OCUVITE-LUTEIN) CAPS capsule Take 1 capsule by mouth every other day.    Yes [provider]  omeprazole (PRILOSEC) 20 MG capsule Take 20 mg by mouth daily.   Yes [provider]  SUMAtriptan (IMITREX) 50 MG tablet Take 50 mg by mouth every 2 (two) hours as needed for migraine. May repeat in 2 hours if headache persists or recurs.   Yes [provider]  Ferrous Sulfate (IRON PO) Take 45 mg by mouth daily.  Patient not taking: Reported on 10/24/2023    [provider]  HYDROcodone -acetaminophen  (NORCO) 5-325 MG tablet Take 1 tablet by mouth every 6 (six) hours as needed for moderate pain. Patient not taking: Reported on 10/24/2023 11/05/19   Ashley Soulier, DPM    Allergies as of  07/27/2023 - Review Complete 10/05/2022  Allergen Reaction Noted   Ceftin [cefuroxime axetil] Itching and Rash 12/24/2015    Family History  Problem Relation Age of Onset   Breast cancer Mother 49   Prostate cancer Father    Ovarian cancer Sister     Social History   Socioeconomic History   Marital status: Widowed    Spouse name: Not on file   Number of children: Not on file   Years of education: Not on file   Highest education level: Not on file  Occupational History   Not on file  Tobacco Use   Smoking status: Never   Smokeless tobacco: Never  Vaping Use   Vaping status: Never Used  Substance and Sexual  Activity   Alcohol use: Yes    Alcohol/week: 1.0 standard drink of alcohol    Types: 1 Glasses of wine per week   Drug use: Never   Sexual activity: Yes    Birth control/protection: None  Other Topics Concern   Not on file  Social History Narrative   Not on file   Social Drivers of Health   Financial Resource Strain: Low Risk  (10/16/2023)   Received from [facility]   Overall Financial Resource Strain (CARDIA)    Difficulty of Paying Living Expenses: Not hard at all  Food Insecurity: No Food Insecurity (10/16/2023)   Received from [facility]   Hunger Vital Sign    Worried About Running Out of Food in the Last Year: Never true    Ran Out of Food in the Last Year: Never true  Transportation Needs: No Transportation Needs (10/16/2023)   Received from [facility]    In the past 12 months, has lack of transportation kept you from medical appointments or from getting medications?: No    Lack of Transportation (Non-Medical): No  Physical Activity: Not on file  Stress: Not on file  Social Connections: Not on file  Intimate Partner Violence: Not on file    Review of Systems: See HPI, otherwise negative ROS  Physical Exam: BP (!) 110/54   Pulse (!) 54   Temp (!) 97.3 F (36.3 C) (Temporal)   Resp 18   Ht 5' 3 (1.6 m)   Wt 68.5 kg   SpO2 99%   BMI 26.75 kg/m  General:   Alert, cooperative in NAD Head:  Normocephalic and atraumatic. Respiratory:  Normal work of breathing. Cardiovascular:  RRR  Impression/Plan: Phyllis Wright is here for cataract surgery.  Risks, benefits, limitations, and alternatives regarding cataract surgery have been reviewed with the patient.  Questions have been answered.  All parties agreeable.   Elsie Carmine, MD  11/13/2023, 9:13 AM

## 2023-11-13 NOTE — Transfer of Care (Signed)
 Immediate Anesthesia Transfer of Care Note  Patient: Phyllis Wright  Procedure(s) Performed: CATARACT EXTRACTION PHACO AND INTRAOCULAR LENS PLACEMENT (IOC) LEFT 6.65 00:42.3 (Left)  Patient Location: PACU  Anesthesia Type: MAC  Level of Consciousness: awake, alert  and patient cooperative  Airway and Oxygen Therapy: Patient Spontanous Breathing and Patient connected to supplemental oxygen  Post-op Assessment: Post-op Vital signs reviewed, Patient's Cardiovascular Status Stable, Respiratory Function Stable, Patent Airway and No signs of Nausea or vomiting  Post-op Vital Signs: Reviewed and stable  Complications: No notable events documented.

## 2023-11-13 NOTE — Anesthesia Postprocedure Evaluation (Signed)
 Anesthesia Post Note  Patient: Phyllis Wright  Procedure(s) Performed: CATARACT EXTRACTION PHACO AND INTRAOCULAR LENS PLACEMENT (IOC) LEFT 6.65 00:42.3 (Left)  Patient location during evaluation: PACU Anesthesia Type: MAC Level of consciousness: awake and alert Pain management: pain level controlled Vital Signs Assessment: post-procedure vital signs reviewed and stable Respiratory status: spontaneous breathing, nonlabored ventilation, respiratory function stable and patient connected to nasal cannula oxygen Cardiovascular status: stable and blood pressure returned to baseline Postop Assessment: no apparent nausea or vomiting Anesthetic complications: no   No notable events documented.   Last Vitals:  Vitals:   11/13/23 0941 11/13/23 0945  BP: 106/61 (!) 111/50  Pulse: (!) 51 (!) 53  Resp: (!) 8 16  Temp: 36.6 C 36.6 C  SpO2: 98% 97%    Last Pain:  Vitals:   11/13/23 0945  TempSrc:   PainSc: 0-No pain                 Tawania Daponte C Charon Akamine

## 2023-11-13 NOTE — Op Note (Signed)
 PREOPERATIVE DIAGNOSIS:  Nuclear sclerotic cataract of the left eye.   POSTOPERATIVE DIAGNOSIS:  Nuclear sclerotic cataract of the left eye.   OPERATIVE PROCEDURE:ORPROCALL@   SURGEON:  Elsie Carmine, MD.   ANESTHESIA:  Anesthesiologist: Ola Donny BROCKS, MD CRNA: Levy Harvey, CRNA  1.      Managed anesthesia care. 2.     0.72ml of Shugarcaine was instilled following the paracentesis   COMPLICATIONS: Viscoelastic was used to raise the pupil margin.  A  Malyugin ring was placed as the pupil would not achieve sufficient pharmacologic dilation to undergo cataract extraction safely.( The ring was removed atraumatically following insertion of the IOL.)    TECHNIQUE:   Stop and chop   DESCRIPTION OF PROCEDURE:  The patient was examined and consented in the preoperative holding area where the aforementioned topical anesthesia was applied to the left eye and then brought back to the Operating Room where the left eye was prepped and draped in the usual sterile ophthalmic fashion and a lid speculum was placed. A paracentesis was created with the side port blade and the anterior chamber was filled with viscoelastic. A near clear corneal incision was performed with the steel keratome. A continuous curvilinear capsulorrhexis was performed with a cystotome followed by the capsulorrhexis forceps. Hydrodissection and hydrodelineation were carried out with BSS on a blunt cannula. The lens was removed in a stop and chop  technique and the remaining cortical material was removed with the irrigation-aspiration handpiece. The capsular bag was inflated with viscoelastic and the Technis [id number] lens was placed in the capsular bag without complication. The remaining viscoelastic was removed from the eye with the irrigation-aspiration handpiece. The wounds were hydrated. The anterior chamber was flushed with BSS and the eye was inflated to physiologic pressure. 0.84ml Vigamox  was placed in the anterior chamber. The wounds  were found to be water tight. The eye was dressed with Combigan . The patient was given protective glasses to wear throughout the day and a shield with which to sleep tonight. The patient was also given drops with which to begin a drop regimen today and will follow-up with me in one day. Implant Name Type Inv. Item Serial No. Manufacturer Lot No. LRB No. Used Action  LENS IOL TECNIS EYHANCE 19.5 - [id number] Intraocular Lens LENS IOL TECNIS EYHANCE 19.5 [id number] [employer]  Left 1 Implanted    Procedure(s): CATARACT EXTRACTION PHACO AND INTRAOCULAR LENS PLACEMENT (IOC) LEFT 6.65 00:42.3 (Left)  Electronically signed: Elsie Carmine 11/13/2023 9:39 AM

## 2023-11-14 ENCOUNTER — Encounter: Payer: Self-pay | Admitting: Ophthalmology

## 2023-11-26 ENCOUNTER — Ambulatory Visit
Admission: RE | Admit: 2023-11-26 | Discharge: 2023-11-26 | Disposition: A | Discharge status: Home / Self Care | Payer: Medicare Other | Source: Ambulatory Visit | Attending: Obstetrics and Gynecology | Admitting: Obstetrics and Gynecology

## 2023-11-26 DIAGNOSIS — Z1231 Encounter for screening mammogram for malignant neoplasm of breast: Secondary | ICD-10-CM | POA: Insufficient documentation

## 2024-02-07 ENCOUNTER — Ambulatory Visit: Payer: Self-pay

## 2024-02-07 DIAGNOSIS — K5730 Diverticulosis of large intestine without perforation or abscess without bleeding: Secondary | ICD-10-CM | POA: Diagnosis not present

## 2024-02-07 DIAGNOSIS — D120 Benign neoplasm of cecum: Secondary | ICD-10-CM | POA: Diagnosis not present

## 2024-02-07 DIAGNOSIS — Z83719 Family history of colon polyps, unspecified: Secondary | ICD-10-CM | POA: Diagnosis not present

## 2024-02-07 DIAGNOSIS — K640 First degree hemorrhoids: Secondary | ICD-10-CM | POA: Diagnosis not present

## 2024-02-07 DIAGNOSIS — Z1211 Encounter for screening for malignant neoplasm of colon: Secondary | ICD-10-CM | POA: Diagnosis present

## 2024-04-24 ENCOUNTER — Other Ambulatory Visit: Payer: Self-pay | Admitting: Internal Medicine

## 2024-04-24 DIAGNOSIS — I10 Essential (primary) hypertension: Secondary | ICD-10-CM

## 2024-04-24 DIAGNOSIS — E785 Hyperlipidemia, unspecified: Secondary | ICD-10-CM

## 2024-05-06 ENCOUNTER — Ambulatory Visit
Admission: RE | Admit: 2024-05-06 | Discharge: 2024-05-06 | Disposition: A | Discharge status: Home / Self Care | Payer: Self-pay | Source: Ambulatory Visit | Attending: Internal Medicine | Admitting: Internal Medicine

## 2024-05-06 DIAGNOSIS — E785 Hyperlipidemia, unspecified: Secondary | ICD-10-CM | POA: Insufficient documentation

## 2024-05-06 DIAGNOSIS — I10 Essential (primary) hypertension: Secondary | ICD-10-CM | POA: Insufficient documentation

## 2024-10-15 ENCOUNTER — Other Ambulatory Visit: Payer: Self-pay | Admitting: Obstetrics and Gynecology

## 2024-10-15 DIAGNOSIS — Z1231 Encounter for screening mammogram for malignant neoplasm of breast: Secondary | ICD-10-CM

## 2024-11-27 ENCOUNTER — Ambulatory Visit
Admission: RE | Admit: 2024-11-27 | Discharge: 2024-11-27 | Disposition: A | Discharge status: Home / Self Care | Source: Ambulatory Visit | Attending: Obstetrics and Gynecology | Admitting: Obstetrics and Gynecology

## 2024-11-27 DIAGNOSIS — Z1231 Encounter for screening mammogram for malignant neoplasm of breast: Secondary | ICD-10-CM | POA: Insufficient documentation
